# Patient Record
Sex: Male | Born: 1955 | Race: Black or African American | Hispanic: No | Marital: Married | Smoking: Former smoker
Health system: Southern US, Community
[De-identification: ages and names within clinical notes are randomized; demographics above are authoritative.]

## PROBLEM LIST (undated history)

## (undated) DIAGNOSIS — I209 Angina pectoris, unspecified: Secondary | ICD-10-CM

## (undated) DIAGNOSIS — E119 Type 2 diabetes mellitus without complications: Secondary | ICD-10-CM

## (undated) DIAGNOSIS — J449 Chronic obstructive pulmonary disease, unspecified: Secondary | ICD-10-CM

## (undated) DIAGNOSIS — Z87442 Personal history of urinary calculi: Secondary | ICD-10-CM

## (undated) DIAGNOSIS — M1990 Unspecified osteoarthritis, unspecified site: Secondary | ICD-10-CM

## (undated) DIAGNOSIS — I10 Essential (primary) hypertension: Secondary | ICD-10-CM

## (undated) HISTORY — DX: Essential (primary) hypertension: I10

## (undated) HISTORY — PX: KIDNEY STONE SURGERY: SHX686

## (undated) HISTORY — PX: ACNE CYST REMOVAL: SUR1112

---

## 2002-03-20 DIAGNOSIS — E785 Hyperlipidemia, unspecified: Secondary | ICD-10-CM | POA: Insufficient documentation

## 2004-03-20 DIAGNOSIS — I10 Essential (primary) hypertension: Secondary | ICD-10-CM | POA: Insufficient documentation

## 2006-06-16 DIAGNOSIS — Z800 Family history of malignant neoplasm of digestive organs: Secondary | ICD-10-CM | POA: Insufficient documentation

## 2006-06-16 DIAGNOSIS — N529 Male erectile dysfunction, unspecified: Secondary | ICD-10-CM | POA: Insufficient documentation

## 2006-06-16 DIAGNOSIS — Z8249 Family history of ischemic heart disease and other diseases of the circulatory system: Secondary | ICD-10-CM | POA: Insufficient documentation

## 2006-10-23 DIAGNOSIS — L259 Unspecified contact dermatitis, unspecified cause: Secondary | ICD-10-CM | POA: Insufficient documentation

## 2007-06-10 DIAGNOSIS — Z8042 Family history of malignant neoplasm of prostate: Secondary | ICD-10-CM | POA: Insufficient documentation

## 2007-06-10 DIAGNOSIS — F17210 Nicotine dependence, cigarettes, uncomplicated: Secondary | ICD-10-CM | POA: Insufficient documentation

## 2007-08-12 ENCOUNTER — Ambulatory Visit: Payer: Self-pay | Admitting: Gastroenterology

## 2008-01-27 ENCOUNTER — Inpatient Hospital Stay: Payer: Self-pay | Admitting: Internal Medicine

## 2008-07-07 DIAGNOSIS — G4710 Hypersomnia, unspecified: Secondary | ICD-10-CM | POA: Insufficient documentation

## 2009-04-30 DIAGNOSIS — E559 Vitamin D deficiency, unspecified: Secondary | ICD-10-CM | POA: Insufficient documentation

## 2011-10-20 LAB — HEPATIC FUNCTION PANEL: ALT: 18 U/L (ref 10–40)

## 2012-12-27 LAB — LIPID PANEL
Cholesterol: 165 mg/dL (ref 0–200)
HDL: 54 mg/dL (ref 35–70)
LDL Cholesterol: 96 mg/dL
Triglycerides: 75 mg/dL (ref 40–160)

## 2012-12-27 LAB — BASIC METABOLIC PANEL
BUN: 14 mg/dL (ref 4–21)
Creatinine: 1.2 mg/dL (ref 0.6–1.3)
GLUCOSE: 88 mg/dL
POTASSIUM: 5.1 mmol/L (ref 3.4–5.3)
SODIUM: 140 mmol/L (ref 137–147)

## 2013-03-11 LAB — HM COLONOSCOPY

## 2013-06-02 ENCOUNTER — Ambulatory Visit: Payer: Self-pay | Admitting: Family Medicine

## 2014-09-03 ENCOUNTER — Encounter: Payer: Self-pay | Admitting: *Deleted

## 2014-09-03 ENCOUNTER — Ambulatory Visit (INDEPENDENT_AMBULATORY_CARE_PROVIDER_SITE_OTHER): Payer: PRIVATE HEALTH INSURANCE | Admitting: Family Medicine

## 2014-09-03 ENCOUNTER — Ambulatory Visit
Admission: RE | Admit: 2014-09-03 | Discharge: 2014-09-03 | Disposition: A | Discharge status: Home / Self Care | Payer: PRIVATE HEALTH INSURANCE | Source: Ambulatory Visit | Attending: Family Medicine | Admitting: Family Medicine

## 2014-09-03 ENCOUNTER — Encounter: Payer: Self-pay | Admitting: Family Medicine

## 2014-09-03 VITALS — BP 130/80 | HR 62 | Temp 98.9°F | Resp 17 | Ht 66.0 in | Wt 196.4 lb

## 2014-09-03 DIAGNOSIS — M47896 Other spondylosis, lumbar region: Secondary | ICD-10-CM | POA: Diagnosis not present

## 2014-09-03 DIAGNOSIS — M179 Osteoarthritis of knee, unspecified: Secondary | ICD-10-CM | POA: Insufficient documentation

## 2014-09-03 DIAGNOSIS — M545 Low back pain: Secondary | ICD-10-CM | POA: Diagnosis not present

## 2014-09-03 DIAGNOSIS — M5410 Radiculopathy, site unspecified: Secondary | ICD-10-CM | POA: Diagnosis not present

## 2014-09-03 DIAGNOSIS — M7040 Prepatellar bursitis, unspecified knee: Secondary | ICD-10-CM | POA: Insufficient documentation

## 2014-09-03 DIAGNOSIS — M1990 Unspecified osteoarthritis, unspecified site: Secondary | ICD-10-CM | POA: Insufficient documentation

## 2014-09-03 DIAGNOSIS — M1710 Unilateral primary osteoarthritis, unspecified knee: Secondary | ICD-10-CM | POA: Insufficient documentation

## 2014-09-03 DIAGNOSIS — K219 Gastro-esophageal reflux disease without esophagitis: Secondary | ICD-10-CM | POA: Insufficient documentation

## 2014-09-03 DIAGNOSIS — M549 Dorsalgia, unspecified: Secondary | ICD-10-CM | POA: Diagnosis present

## 2014-09-03 MED ORDER — PREDNISONE 10 MG PO TABS: ORAL_TABLET | ORAL | Status: AC

## 2014-09-03 NOTE — Progress Notes (Signed)
Patient ID: Jesus Mack, male   DOB: [birth year], 59 y.o.   MRN: [id number]   Patient: Jesus Mack Male    DOB: Dec 08, 1955   59 y.o.   MRN: [id number] Visit Date: 09/03/2014  Today's Provider: Lelon Huh, MD   Chief Complaint  Patient presents with  . Back Pain    started last few weeks, but got worst last couple of days.  . Leg Pain    Right. started a few days later after the back pain had started.   Subjective:    Back Pain This is a new problem. The current episode started 1 to 4 weeks ago. The problem occurs daily (started intermittently and now constant pain). The problem has been gradually worsening since onset. The pain is present in the lumbar spine and gluteal. The quality of the pain is described as aching and stabbing. The pain radiates to the right foot (right leg). The pain is at a severity of 6/10. The pain is moderate. The pain is the same all the time. Exacerbated by: with/out activity. Stiffness is present all day. Associated symptoms include leg pain and numbness. He has tried NSAIDs and heat for the symptoms. The treatment provided mild relief.  Leg Pain  The incident occurred more than 1 week ago (about the same time when the back pain started). Incident location: "don't remember doing nothing straighneous" The injury mechanism is unknown. The pain is present in the right leg, right thigh and right hip. The quality of the pain is described as stabbing (dull pain in hip, but stabbing pain in leg.). The pain is at a severity of 8/10. The pain is moderate. The pain has been constant since onset. Associated symptoms include numbness. He reports no foreign bodies present. Nothing aggravates the symptoms. He has tried heat and NSAIDs for the symptoms. The treatment provided mild relief.   He states he had similar pain about 15 years ago attributed to herniated disk and required steroid injections.    Previous Medications   AMLODIPINE (NORVASC) 10 MG TABLET    Take by  mouth.   ASPIRIN 81 MG TABLET       LOVASTATIN (MEVACOR) 40 MG TABLET    Take by mouth.   MULTIPLE VITAMIN PO    Take by mouth.   SILDENAFIL (VIAGRA) 100 MG TABLET    Take by mouth.   TRIAMCINOLONE CREAM (KENALOG) 0.1 %    TRIAMCINOLONE ACETONIDE, 0.1% (External Cream)  Apply sparingly to affected area twice daily for 0 days  Quantity: 30.00;  Refills: 1   Ordered :15-Nov-2009  Meyer Cory ;  Started 15-Nov-2009 Active Comments: DX: 692.9    Review of Systems  Constitutional: Positive for activity change.       Because of the pain  HENT: Negative.   Eyes: Negative.   Respiratory: Negative.   Cardiovascular: Negative.   Gastrointestinal: Negative.   Endocrine: Negative.   Genitourinary: Negative.   Musculoskeletal: Positive for back pain.       Back and right leg  Skin: Negative.   Allergic/Immunologic: Negative.   Neurological: Positive for numbness.       Lower back numbness  Hematological: Negative.   Psychiatric/Behavioral: Negative.     History  Substance Use Topics  . Smoking status: Current Every Day Smoker -- 1.00 packs/day for 30 years    Types: Cigarettes  . Smokeless tobacco: Not on file  . Alcohol Use: No   Objective:   BP 130/80 mmHg  Pulse 62  Temp(Src) 98.9 F (37.2 C) (Oral)  Resp 17  Ht 5\' 6"  (1.676 m)  Wt 196 lb 6.4 oz (89.086 kg)  BMI 31.71 kg/m2  SpO2 97%  Physical Exam   General Appearance:    Alert, cooperative, no distress  Eyes:    PERRL, conjunctiva/corneas clear, EOM's intact       Lungs:     Clear to auscultation bilaterally, respirations unlabored  Heart:    Regular rate and rhythm  Neurologic:   Awake, alert, oriented x 3. No apparent focal neurological           defect.   MS:   Tender over lower lumbar spine. No gross deformities. +5 muslce strength both LEs. Normal s/s       Assessment & Plan:     1. Back pain with right-sided radiculopathy  - DG Lumbar Spine Complete; Future - predniSONE (DELTASONE) 10 MG  tablet; 6 tablets for 2 days, then 5 for 2 days, then 4 for 2 days, then 3 for 2 days, then 2 for 2 days, then 1 for 2 days.  Dispense: 42 tablet; Refill: 0    Follow up: No Follow-up on file.

## 2014-09-04 ENCOUNTER — Telehealth: Payer: Self-pay | Admitting: Family Medicine

## 2014-09-04 ENCOUNTER — Encounter: Payer: Self-pay | Admitting: *Deleted

## 2014-09-04 NOTE — Telephone Encounter (Signed)
Pt stated he was returning the nurse's call about his X ray and would like a call back. Thanks TNP

## 2014-09-07 NOTE — Telephone Encounter (Signed)
Patient notified of results.

## 2014-12-16 ENCOUNTER — Other Ambulatory Visit: Payer: Self-pay | Admitting: Family Medicine

## 2015-01-14 ENCOUNTER — Other Ambulatory Visit: Payer: Self-pay | Admitting: Family Medicine

## 2015-02-18 ENCOUNTER — Other Ambulatory Visit: Payer: Self-pay | Admitting: Family Medicine

## 2015-03-19 ENCOUNTER — Other Ambulatory Visit: Payer: Self-pay | Admitting: Family Medicine

## 2015-04-17 ENCOUNTER — Other Ambulatory Visit: Payer: Self-pay | Admitting: Family Medicine

## 2015-05-20 ENCOUNTER — Other Ambulatory Visit: Payer: Self-pay | Admitting: Family Medicine

## 2015-06-17 ENCOUNTER — Other Ambulatory Visit: Payer: Self-pay | Admitting: Family Medicine

## 2015-06-18 ENCOUNTER — Ambulatory Visit (INDEPENDENT_AMBULATORY_CARE_PROVIDER_SITE_OTHER): Payer: PRIVATE HEALTH INSURANCE | Admitting: Family Medicine

## 2015-06-18 ENCOUNTER — Encounter: Payer: Self-pay | Admitting: Family Medicine

## 2015-06-18 VITALS — BP 128/62 | HR 101 | Temp 97.9°F | Resp 18 | Wt 200.0 lb

## 2015-06-18 DIAGNOSIS — M545 Low back pain, unspecified: Secondary | ICD-10-CM

## 2015-06-18 DIAGNOSIS — M7630 Iliotibial band syndrome, unspecified leg: Secondary | ICD-10-CM | POA: Insufficient documentation

## 2015-06-18 DIAGNOSIS — M7632 Iliotibial band syndrome, left leg: Secondary | ICD-10-CM

## 2015-06-18 MED ORDER — PREDNISONE 10 MG PO TABS: ORAL_TABLET | ORAL | Status: DC

## 2015-06-18 NOTE — Progress Notes (Signed)
Patient: Jesus Mack Male    DOB: [birth year]   60 y.o.   MRN: [id number] Visit Date: 06/18/2015  Today's Provider: Lelon Huh, MD   Chief Complaint  Patient presents with  . Back Pain  . Leg Pain   Subjective:    Back Pain This is a new problem. Episode onset: 4 days ago. The problem has been gradually improving since onset. The pain is present in the lumbar spine (left side). The quality of the pain is described as burning, stabbing and aching. The pain radiates to the left knee. The pain is moderate. The symptoms are aggravated by standing. Associated symptoms include leg pain and numbness (in left leg). Pertinent negatives include no abdominal pain, chest pain, dysuria, fever, headaches, tingling, weakness or weight loss. He has tried NSAIDs for the symptoms. The treatment provided moderate relief.  Leg Pain  Incident onset: 1 day ago. There was no injury mechanism. The pain is present in the left leg (starts i lower back and radiates down to left knee). Associated symptoms include numbness (in left leg). Pertinent negatives include no tingling. He has tried NSAIDs for the symptoms. The treatment provided mild relief.  Patient reports last night he noticed a hard knot on the side of his left leg.   He was seen for back pain last June and LS spine XR showed degenerative disc disease with disc height loss at L2-3 andL3-4. There is bilateral facet arthropathy at L4-5 and L5-S1.  He was treated with 12 day prednisone taper at that time which worked well.     No Known Allergies Previous Medications   AMLODIPINE (NORVASC) 10 MG TABLET    TAKE 1 TABLET (10 MG TOTAL) BY MOUTH DAILY. NEED TO SCHEDULE OFFICE VISIT FOR FOLLOW UP THIS MONTH   ASPIRIN 81 MG TABLET       LOVASTATIN (MEVACOR) 40 MG TABLET    Take by mouth.   MULTIPLE VITAMIN PO    Take by mouth.   SILDENAFIL (VIAGRA) 100 MG TABLET    Take by mouth.   TRIAMCINOLONE CREAM (KENALOG) 0.1 %    TRIAMCINOLONE ACETONIDE,  0.1% (External Cream)  Apply sparingly to affected area twice daily for 0 days  Quantity: 30.00;  Refills: 1   Ordered :15-Nov-2009  Meyer Cory ;  Started 15-Nov-2009 Active Comments: DX: 692.9    Review of Systems  Constitutional: Negative for fever, chills, weight loss and appetite change.  Respiratory: Positive for chest tightness (feels a catch in his chest wall muscle when twisting). Negative for shortness of breath and wheezing.   Cardiovascular: Negative for chest pain and palpitations.  Gastrointestinal: Negative for nausea, vomiting and abdominal pain.  Genitourinary: Negative for dysuria.  Musculoskeletal: Positive for myalgias (left leg), back pain and arthralgias.  Neurological: Positive for numbness (in left leg). Negative for tingling, weakness and headaches.    Social History  Substance Use Topics  . Smoking status: Current Every Day Smoker -- 1.00 packs/day for 30 years    Types: Cigarettes  . Smokeless tobacco: Not on file  . Alcohol Use: No   Objective:   BP 128/62 mmHg  Pulse 101  Temp(Src) 97.9 F (36.6 C) (Oral)  Resp 18  Wt 200 lb (90.719 kg)  SpO2 95%  Physical Exam  General appearance: alert, well developed, well nourished, cooperative and in no distress Head: Normocephalic, without obvious abnormality, atraumatic MS: Moderate tenderness over LS spine and left paraspinous muscles. No tenderness on right.  Tender down right side of hip and lateral leg along ITB. No gross deformities. Normal strength and tone of LEs.      Assessment & Plan:     1. Left-sided low back pain without sciatica Responded well to prednisone in the past.  - predniSONE (DELTASONE) 10 MG tablet; 6 tablets for 2 days, then 5 for 2 days, then 4 for 2 days, then 3 for 2 days, then 2 for 2 days, then 1 for 2 days.  Dispense: 42 tablet; Refill: 0  2. Iliotibial band syndrome, left  - predniSONE (DELTASONE) 10 MG tablet; 6 tablets for 2 days, then 5 for 2 days, then 4 for  2 days, then 3 for 2 days, then 2 for 2 days, then 1 for 2 days.  Dispense: 42 tablet; Refill: 0        Lelon Huh, MD  [facility]

## 2015-06-25 ENCOUNTER — Encounter: Payer: Self-pay | Admitting: Family Medicine

## 2015-06-25 ENCOUNTER — Ambulatory Visit (INDEPENDENT_AMBULATORY_CARE_PROVIDER_SITE_OTHER): Payer: PRIVATE HEALTH INSURANCE | Admitting: Family Medicine

## 2015-06-25 VITALS — BP 142/72 | HR 72 | Temp 98.3°F | Resp 16 | Wt 200.0 lb

## 2015-06-25 DIAGNOSIS — M545 Low back pain, unspecified: Secondary | ICD-10-CM

## 2015-06-25 DIAGNOSIS — M5416 Radiculopathy, lumbar region: Secondary | ICD-10-CM | POA: Diagnosis not present

## 2015-06-25 MED ORDER — PREDNISONE 10 MG PO TABS: ORAL_TABLET | ORAL | Status: AC

## 2015-06-25 NOTE — Progress Notes (Signed)
Patient ID: Jesus Mack, male   DOB: 14-Aug-1955, 60 y.o.   MRN: [id number]       Patient: Jesus Mack Male    DOB: November 17, 1955   60 y.o.   MRN: [id number] Visit Date: 06/25/2015  Today's Provider: Lelon Huh, MD   Chief Complaint  Patient presents with  . Back Pain  . Leg Pain   Subjective:    Back Pain This is a recurrent problem. The current episode started yesterday. The problem has been waxing and waning since onset. The pain is present in the lumbar spine. The quality of the pain is described as aching. The pain is at a severity of 8/10. The pain is moderate. The symptoms are aggravated by bending, lying down and sitting. Stiffness is present all day. Associated symptoms include leg pain. Pertinent negatives include no fever, numbness, tingling or weakness.  Leg Pain  There was no injury mechanism. The pain is present in the left leg. The quality of the pain is described as aching. The pain is moderate. The pain has been constant since onset. Pertinent negatives include no muscle weakness, numbness or tingling. Associated symptoms comments: Patient describes pain as throbbing.   Patient was seen on 06/18/15 with back pain and pain down side of his left leg. He was prescribed 12 day prednisone taper and pain had nearly resolved. He states yesterday he was changing tires on vehicle at work and back and leg started hurting again, and is much worse this morning. This time pain radiates down the front of his leg. Does not feel week in legs.      No Known Allergies Previous Medications   AMLODIPINE (NORVASC) 10 MG TABLET    TAKE 1 TABLET (10 MG TOTAL) BY MOUTH DAILY. NEED TO SCHEDULE OFFICE VISIT FOR FOLLOW UP THIS MONTH   ASPIRIN 81 MG TABLET       LOVASTATIN (MEVACOR) 40 MG TABLET    Take by mouth.   MULTIPLE VITAMIN PO    Take by mouth.   PREDNISONE (DELTASONE) 10 MG TABLET    6 tablets for 2 days, then 5 for 2 days, then 4 for 2 days, then 3 for 2 days, then 2 for 2 days,  then 1 for 2 days.   SILDENAFIL (VIAGRA) 100 MG TABLET    Take by mouth.   TRIAMCINOLONE CREAM (KENALOG) 0.1 %    TRIAMCINOLONE ACETONIDE, 0.1% (External Cream)  Apply sparingly to affected area twice daily for 0 days  Quantity: 30.00;  Refills: 1   Ordered :15-Nov-2009  Meyer Cory ;  Started 15-Nov-2009 Active Comments: DX: 692.9    Review of Systems  Constitutional: Positive for activity change. Negative for fever, chills, diaphoresis, appetite change, fatigue and unexpected weight change.  Gastrointestinal: Negative.   Musculoskeletal: Positive for back pain.  Neurological: Negative for tingling, tremors, weakness and numbness.    Social History  Substance Use Topics  . Smoking status: Current Every Day Smoker -- 1.00 packs/day for 30 years    Types: Cigarettes  . Smokeless tobacco: Not on file  . Alcohol Use: No   Objective:   BP 142/72 mmHg  Temp(Src) 98.3 F (36.8 C)  Resp 16  Wt 200 lb (90.719 kg)  Physical Exam  General appearance: alert, well developed, well nourished, cooperative and in no distress Head: Normocephalic, without obvious abnormality, atraumatic Back: Mild tenderness of lumbar spine, no gross deformities: Extremities: Tender left anterior leg and left hip. No swelling or gross deformities. Positive straight  leg raise, no pain with knee extension.       Assessment & Plan:     1. Left-sided low back pain without sciatica  - Ambulatory referral to Physical Therapy - predniSONE (DELTASONE) 10 MG tablet; 6 tablets for 2 days, then 5 for 2 days, then 4 for 2 days, then 3 for 2 days, then 2 for 2 days, then 1 daily  Dispense: 30 tablet; Refill: 0  2. Lumbar radiculopathy Initially responded well to prednisone. Will go back up to 60mg  daily and initiated PT referral. Consider MRI if worsening after starting PT, of not improved after completing PT.  - Ambulatory referral to Physical Therapy - predniSONE (DELTASONE) 10 MG tablet; 6 tablets for 2  days, then 5 for 2 days, then 4 for 2 days, then 3 for 2 days, then 2 for 2 days, then 1 daily  Dispense: 30 tablet; Refill: 0        Lelon Huh, MD  [facility]

## 2015-07-13 ENCOUNTER — Other Ambulatory Visit: Payer: Self-pay | Admitting: Family Medicine

## 2015-07-14 ENCOUNTER — Other Ambulatory Visit: Payer: Self-pay | Admitting: Family Medicine

## 2015-07-28 ENCOUNTER — Ambulatory Visit: Payer: No Typology Code available for payment source | Attending: Family Medicine | Admitting: Physical Therapy

## 2015-07-28 ENCOUNTER — Encounter: Payer: Self-pay | Admitting: Physical Therapy

## 2015-07-28 ENCOUNTER — Encounter: Payer: Self-pay | Admitting: Family Medicine

## 2015-07-28 ENCOUNTER — Ambulatory Visit (INDEPENDENT_AMBULATORY_CARE_PROVIDER_SITE_OTHER): Payer: PRIVATE HEALTH INSURANCE | Admitting: Family Medicine

## 2015-07-28 VITALS — BP 120/74 | HR 68 | Temp 98.2°F | Resp 16 | Ht 66.0 in | Wt 198.0 lb

## 2015-07-28 DIAGNOSIS — Z720 Tobacco use: Secondary | ICD-10-CM

## 2015-07-28 DIAGNOSIS — R262 Difficulty in walking, not elsewhere classified: Secondary | ICD-10-CM | POA: Diagnosis present

## 2015-07-28 DIAGNOSIS — I10 Essential (primary) hypertension: Secondary | ICD-10-CM

## 2015-07-28 DIAGNOSIS — E559 Vitamin D deficiency, unspecified: Secondary | ICD-10-CM | POA: Diagnosis not present

## 2015-07-28 DIAGNOSIS — E785 Hyperlipidemia, unspecified: Secondary | ICD-10-CM

## 2015-07-28 DIAGNOSIS — M5416 Radiculopathy, lumbar region: Secondary | ICD-10-CM

## 2015-07-28 DIAGNOSIS — M545 Low back pain, unspecified: Secondary | ICD-10-CM

## 2015-07-28 NOTE — Patient Instructions (Signed)
   Please stop smoking   Call for referral for low dose lung CT scan at your convenience.

## 2015-07-28 NOTE — Therapy (Signed)
[place] MAIN [facility] SERVICES [address] [place], [place], [address] Phone: [phone]   Fax:  [phone]  Physical Therapy Evaluation  Patient Details  Name: Jesus Mack MRN: [id number] Date of Birth: [birth year] Referring Provider: Lelon Huh MD  Encounter Date: 07/28/2015      PT End of Session - 07/28/15 1131    Visit Number 1   Number of Visits 9   Date for PT Re-Evaluation 08/25/15   PT Start Time 1020   PT Stop Time 1105   PT Time Calculation (min) 45 min   Activity Tolerance Patient tolerated treatment well;No increased pain   Behavior During Therapy [facility] for tasks assessed/performed      Past Medical History  Diagnosis Date  . Hypertension     controlled with medication;     Past Surgical History  Procedure Laterality Date  . Acne cyst removal Right     Shoulder  . Kidney stone surgery      Blasted    There were no vitals filed for this visit.       Subjective Assessment - 07/28/15 1026    Subjective 60 yo male s/p low back pain with LLE radiculopathy; He reports hurting himself at work (he was putting on a set of tires) He reports that he was using a machine that blows up the tire real fast; He reports that when he uses it, he puts it against his body and then you feel a rebound into the stomach. He reports that this occurred April 4-5, 2017; He reports feeling immediate stabbing pain. He went to MD and he was prescribed a prednisone taper. He reports that 2-3 weeks ago when he stopped taking the prednisone he started feeling pain and numbness in his back and down his left leg; He reports that this is not the first time he has had back injury; He reports having an injury last summer (similar injury) and reports that the prednisone took away all pain. He reports that this is the first time he has had numbness and progression of symptoms down his leg; He is still working full-time, Lobbyist; He works as a  Dealer; He has a history of chronic back pain and has undergone steriod injections and prednisone tapers for pain control;    Pertinent History personal factors affecting rehab: Work tasks (as a Dealer with constant lifting and bending)   How long can you sit comfortably? 45 min-1 hour   How long can you stand comfortably? 30 min;    How long can you walk comfortably? 1/2 mile   Diagnostic tests He had x-rays last year which showed spondylosis but otherwise unremarkable; No radiographs recently;    Patient Stated Goals reduce numbness in LE   Currently in Pain? Yes   Pain Score 6    Pain Location Knee   Pain Orientation Left   Pain Descriptors / Indicators Numbness   Pain Type Chronic pain   Pain Onset More than a month ago   Pain Frequency Intermittent   Aggravating Factors  standing or sitting will increase numbness;    Pain Relieving Factors moving around   Effect of Pain on Daily Activities trying to stay active;             [facility] PT Assessment - 07/28/15 0001    Assessment   Medical Diagnosis Low back pain with LLE radiculopathy   Referring Provider Lelon Huh MD   Onset Date/Surgical Date 06/19/15  Hand Dominance Right   Next MD Visit not scheduled   Prior Therapy denies any PT for this condition;    Precautions   Precautions None   Restrictions   Weight Bearing Restrictions No   Other Position/Activity Restrictions no restrictions reported   Balance Screen   Has the patient fallen in the past 6 months No   Has the patient had a decrease in activity level because of a fear of falling?  No   Is the patient reluctant to leave their home because of a fear of falling?  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;    Leisure used to fish, no significant hobbies;    Cognition   Overall Cognitive Status Within Functional Limits for tasks assessed   Observation/Other Assessments   Observations demonstrates increased pain with repeated lumbar extension;    Sensation   Light Touch Appears Intact   Additional Comments reports numbness down LLE anterior thigh to knee but has intact light touch/deep pressure   Coordination   Gross Motor Movements are Fluid and Coordinated Yes   Fine Motor Movements are Fluid and Coordinated Yes   Posture/Postural Control   Posture Comments sits with erect posture, no abnormality noted   AROM   Overall AROM Comments BUE and BLE AROM is WFL; demonstrates functional lumbar ROM   Strength   Overall Strength Comments BLE grossly 4+/5;    Palpation   Spinal mobility hypomobility noted at L4, L5 with slight increase in discomfort with PA mobs; otherwise lumbar spine is normal;   Palpation comment has mild tenderness to palpation of lumbosacral paraspinals;    FABER test   findings Positive   Side LEft   Slump test   Findings Positive   Side Left   Prone Knee Bend Test   Findings Negative   Side Left   Straight Leg Raise   Findings Positive   Side  Left   Comment at 50 degrees flexion   Transfers   Comments able to transfer without HHA   Ambulation/Gait   Gait Comments ambulates independently without AD demonstrating normal gait pattern   Standardized Balance Assessment   Five times sit to stand comments  12.5 sec (low fall risk)   10 Meter Walk 1.17 m/s without AD (community ambulator, low fall risk)       TREATMENT: PT initiated HEP: Hooklying: Lumbar trunk rotation to right only x2 min with cues to avoid painful ROM; Single leg to chest 15 sec hold x2 on LLE; Seated: Trunk flexion AROM x5 reps Patient required min-moderate verbal/tactile cues for correct exercise  technique.                     PT Education - 07/28/15 1130    Education provided Yes   Education Details HEP initiated, posture, recommendations/findings   Person(s) Educated Patient   Methods Explanation;Verbal cues;Handout   Comprehension Verbalized understanding;Returned demonstration;Verbal cues required             PT Long Term Goals - 07/28/15 1142    PT LONG TERM GOAL #1   Title Patient will be independent in  home exercise program to improve strength/mobility for better functional independence with ADLs.   Time 4   Period Weeks   Status New   PT LONG TERM GOAL #2   Title Patient will report a worst pain of 3/10 on VAS in      back/LLE       to improve tolerance with ADLs and reduced symptoms with activities.    Time 4   Period Weeks   Status New   PT LONG TERM GOAL #3   Title Patient will reduce modified Oswestry score to <20 as to demonstrate minimal disability with ADLs including improved sleeping tolerance, walking/sitting tolerance etc for better mobility with ADLs.    Time 4   Period Weeks   Status New   PT LONG TERM GOAL #4   Title Patient will report no numbness in LLE over 2-3 days for centralization of symptoms and to reduce occurance of weakness in LE.   Time 4   Period Weeks   Status New               Plan - 07/28/15 1131    Clinical Impression Statement 60 yo Male reports experiencing an injury at work where he was trying to inflate a large tire and the machine rebound brought on immediate low back pain. He has had chronic back pain in the past that was relieved with steriod injections and/or prednisone. He went to see physician who prescribed prednisone with some relief. However for last 2-3 weeks he has been experiencing increased numbness in LLE to knee. Patient exhibits increased tightness and pain at L4, L5 with PA mobs. He reports numbness along L4 dermatome. Patient demonstrates a lumbar flexion preference with increased  numbness with repeated prone position. He would benefit from skilled PT intervention to improve lumbar ROM, reduce pain with ADL s and reduce numbness in LE for return to PLOF.    Rehab Potential Good   Clinical Impairments Affecting Rehab Potential positive: good PLOF, young in age; negative: history of re-injury, repetitive tasks at work; Patient's clinical presentation is stable as he has has minimal pain and numbness is localized to low back/LE;    PT Frequency 2x / week   PT Duration 4 weeks   PT Treatment/Interventions Cryotherapy;Electrical Stimulation;Moist Heat;Traction;Therapeutic exercise;Therapeutic activities;Functional mobility training;Ultrasound;Patient/family education;Manual techniques;Taping;Energy conservation;Dry needling   PT Next Visit Plan advance HEP, try traction   PT Home Exercise Plan initiated- see patient instructions   Consulted and Agree with Plan of Care Patient      Patient will benefit from skilled therapeutic intervention in order to improve the following deficits and impairments:  Hypomobility, Pain, Decreased mobility, Decreased range of motion, Improper body mechanics, Impaired flexibility  Visit Diagnosis: Left-sided low back pain without sciatica - Plan: PT plan of care cert/re-cert  Radiculopathy, lumbar region - Plan: PT plan of care cert/re-cert  Difficulty in walking, not elsewhere classified - Plan: PT plan of care cert/re-cert     Problem List Patient Active Problem List   Diagnosis Date Noted  . Lower back pain 06/18/2015  . Iliotibial band syndrome 06/18/2015  . Gastroesophageal reflux disease 09/03/2014  . Arthritis, degenerative 09/03/2014  . Arthritis of knee, degenerative 09/03/2014  . Vitamin D deficiency 04/30/2009  . Difficulty staying awake 07/07/2008  . Family history of malignant neoplasm of prostate 06/10/2007  . Tobacco abuse 06/10/2007  . Family history of cardiovascular disease 06/16/2006  . Family history of cancer  of digestive system 06/16/2006  . 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 Essential hypertension 03/20/2004  . HLD (hyperlipidemia) 03/20/2002    Corion Sherrod PT, DPT 07/28/2015, 11:45 AM  [place] MAIN [facility] SERVICES [address] [place], [place], [address] Phone: [phone]   Fax:  [phone]  Name: Jesus Mack MRN: [id number] Date of Birth: Apr 16, 1955

## 2015-07-28 NOTE — Patient Instructions (Signed)
  Copyright  VHI. All rights reserved. Knee to Chest (Flexion)   Pull knee toward chest. Feel stretch in lower back or buttock area. Breathing deeply, Hold __15__ seconds. Repeat with other knee. Repeat _2-3___ times. Do _2-3___ sessions per day.  http://[email or web address].exer.[email or web address]   Copyright  VHI. All rights reserved.   Lower Trunk Rotation Stretch  Lying on back with knees bent, Keeping back flat and feet together, rotate knees to right side and then back to middle slowly and in pain free range of motion. Avoid going to left side;  Hold _2___ seconds. Repeat for 1-2 minutes. Do __1__ sets per session. Do __2-3__ sessions per day.  http://orth.exer.us/122    Low Back Stretch (Sitting)    Sit with legs apart, bend forward slowly from the neck down, feeling a gentle stretch progress to the low back. Hold _2-3___ seconds. Repeat _10___ times. Do 2____ sessions per day.  Copyright  VHI. All rights reserved.

## 2015-07-28 NOTE — Progress Notes (Signed)
Patient: Jesus Mack Male    DOB: 18-Oct-1955   60 y.o.   MRN: [id number] Visit Date: 07/28/2015  Today's Provider: Lelon Huh, MD   Chief Complaint  Patient presents with  . Follow-up  . Hypertension  . Hyperlipidemia   Subjective:    HPI      Hypertension, follow-up:  BP Readings from Last 3 Encounters:  07/28/15 120/74  06/25/15 142/72  06/18/15 128/62    He was last seen for hypertension 12/27/2012.  BP at that visit was 126/72. Management since that visit includes; no changes.He reports fair compliance with treatment. He is not having side effects. none  He is exercising. He is not adherent to low salt diet.   Outside blood pressures are no. He is experiencing none.  Patient denies none.   Cardiovascular risk factors include none.  Use of agents associated with hypertension: none.   ----------------------------------------------------------------------    Lipid/Cholesterol, Follow-up:   Last seen for this 12/27/2012.  Management since that visit includes; no changes.  Last Lipid Panel:    Component Value Date/Time   CHOL 165 12/27/2012   TRIG 75 12/27/2012   HDL 54 12/27/2012   LDLCALC 96 12/27/2012    He reports fair compliance with treatment. He is not having side effects. none  Wt Readings from Last 3 Encounters:  07/28/15 198 lb (89.812 kg)  06/25/15 200 lb (90.719 kg)  06/18/15 200 lb (90.719 kg)    ----------------------------------------------------------------------  Tobacco abuse: Is still smoking about a pack a day. He would like to quit, but hasn't tried lately. Is not interested in prescription medications. .    No Known Allergies Previous Medications   AMLODIPINE (NORVASC) 10 MG TABLET    Take 1 tablet (10 mg total) by mouth daily.   ASPIRIN 81 MG TABLET       LOVASTATIN (MEVACOR) 40 MG TABLET    TAKE 1 TABLET BY MOUTH EVERY DAY   MULTIPLE VITAMIN PO    Take by mouth.    Review of Systems    Constitutional: Negative for fever, chills and appetite change.  Respiratory: Negative for chest tightness, shortness of breath and wheezing.   Cardiovascular: Negative for chest pain and palpitations.  Gastrointestinal: Negative for nausea, vomiting and abdominal pain.    Social History  Substance Use Topics  . Smoking status: Current Every Day Smoker -- 1.00 packs/day for 30 years    Types: Cigarettes  . Smokeless tobacco: Not on file  . Alcohol Use: No   Objective:   BP 120/74 mmHg  Pulse 68  Temp(Src) 98.2 F (36.8 C) (Oral)  Resp 16  Ht 5\' 6"  (1.676 m)  Wt 198 lb (89.812 kg)  BMI 31.97 kg/m2  SpO2 95%  Physical Exam   General Appearance:    Alert, cooperative, no distress  Eyes:    PERRL, conjunctiva/corneas clear, EOM's intact       Lungs:     Clear to auscultation bilaterally, respirations unlabored  Heart:    Regular rate and rhythm  Neurologic:   Awake, alert, oriented x 3. No apparent focal neurological           defect.       EKG: NSR     Assessment & Plan:     1. HLD (hyperlipidemia) He is tolerating lovastatin well with no adverse effects.   - Lipid panel - Hepatic function panel  2. Essential hypertension Well controlled.  Continue current medications.   -  EKG 12-Lead - PSA - Renal function panel  3. Vitamin D deficiency  - VITAMIN D 25 Hydroxy (Vit-D Deficiency, Fractures)  4. Tobacco abuse Advised to quit smoking. Discussed and given printed information regarding low dose CT screening.        Lelon Huh, MD  [facility]

## 2015-07-29 LAB — LIPID PANEL
Chol/HDL Ratio: 3.3 ratio units (ref 0.0–5.0)
Cholesterol, Total: 183 mg/dL (ref 100–199)
HDL: 56 mg/dL (ref >39)
LDL CALC: 110 mg/dL — AB (ref 0–99)
Triglycerides: 87 mg/dL (ref 0–149)
VLDL CHOLESTEROL CAL: 17 mg/dL (ref 5–40)

## 2015-07-29 LAB — HEPATIC FUNCTION PANEL
ALT: 13 IU/L (ref 0–44)
AST: 20 IU/L (ref 0–40)
Alkaline Phosphatase: 64 IU/L (ref 39–117)
BILIRUBIN TOTAL: 0.3 mg/dL (ref 0.0–1.2)
Bilirubin, Direct: 0.09 mg/dL (ref 0.00–0.40)
TOTAL PROTEIN: 7.2 g/dL (ref 6.0–8.5)

## 2015-07-29 LAB — RENAL FUNCTION PANEL
ALBUMIN: 4.2 g/dL (ref 3.5–5.5)
BUN/Creatinine Ratio: 9 (ref 9–20)
BUN: 10 mg/dL (ref 6–24)
CALCIUM: 9.6 mg/dL (ref 8.7–10.2)
CHLORIDE: 103 mmol/L (ref 96–106)
CO2: 24 mmol/L (ref 18–29)
Creatinine, Ser: 1.16 mg/dL (ref 0.76–1.27)
GFR calc Af Amer: 79 mL/min/{1.73_m2} (ref >59)
GFR calc non Af Amer: 69 mL/min/{1.73_m2} (ref >59)
GLUCOSE: 86 mg/dL (ref 65–99)
PHOSPHORUS: 3.5 mg/dL (ref 2.5–4.5)
POTASSIUM: 5.3 mmol/L — AB (ref 3.5–5.2)
Sodium: 145 mmol/L — ABNORMAL HIGH (ref 134–144)

## 2015-07-29 LAB — VITAMIN D 25 HYDROXY (VIT D DEFICIENCY, FRACTURES): VIT D 25 HYDROXY: 26.8 ng/mL — AB (ref 30.0–100.0)

## 2015-07-29 LAB — PSA: Prostate Specific Ag, Serum: 0.6 ng/mL (ref 0.0–4.0)

## 2015-08-02 ENCOUNTER — Ambulatory Visit: Payer: No Typology Code available for payment source | Admitting: Physical Therapy

## 2015-08-02 ENCOUNTER — Encounter: Payer: Self-pay | Admitting: Physical Therapy

## 2015-08-02 DIAGNOSIS — M5416 Radiculopathy, lumbar region: Secondary | ICD-10-CM

## 2015-08-02 DIAGNOSIS — R262 Difficulty in walking, not elsewhere classified: Secondary | ICD-10-CM

## 2015-08-02 DIAGNOSIS — M545 Low back pain, unspecified: Secondary | ICD-10-CM

## 2015-08-02 NOTE — Therapy (Signed)
[place] MAIN [facility] SERVICES [address] [place], [place], [address] Phone: [phone]   Fax:  [phone]  Physical Therapy Treatment  Patient Details  Name: Jesus Mack MRN: [id number] Date of Birth: 10-07-1955 Referring Provider: Lelon Huh MD  Encounter Date: 08/02/2015      PT End of Session - 08/02/15 1709    Visit Number 2   Number of Visits 9   Date for PT Re-Evaluation 08/25/15   PT Start Time 1630   PT Stop Time 1710   PT Time Calculation (min) 40 min   Activity Tolerance Patient tolerated treatment well;No increased pain   Behavior During Therapy [facility] for tasks assessed/performed      Past Medical History  Diagnosis Date  . Hypertension     controlled with medication;     Past Surgical History  Procedure Laterality Date  . Acne cyst removal Right     Shoulder  . Kidney stone surgery      Blasted    There were no vitals filed for this visit.      Subjective Assessment - 08/02/15 1641    Subjective Patient reports feeling no low back pain since last session. 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 45 min-1 hour   How long can you stand comfortably? 30 min;    How long can you walk comfortably? 1/2 mile   Diagnostic tests He had x-rays last year which showed spondylosis but otherwise unremarkable; No radiographs recently;    Patient Stated Goals reduce numbness in LE   Currently in Pain? Yes   Pain Score 1    Pain Location Knee   Pain Orientation Left   Pain Descriptors / Indicators Aching   Pain Type Chronic pain   Pain Onset More than a month ago      TREATMENT: Patient reports no low back pain; He reports compliance  with HEP;  PT assessed LLE knee, and identified lateral tracking of left patella; PT instructed patient in LLE quad strengthening for better knee stability: Quad set with hip ER 3 sec hold x10; SLR flexion with hip ER x10 with cues to keep RLE bent for better back support;  Sitting: LAQ 3 sec hold x10 LLE with cues to increase terminal knee extension for better quad strengthening;  Standing with green tband around knee: LLE terminal knee extension 3 sec hold x10  Patient reports less LLE knee discomfort in standing; however he continues to have 3/10 LLE knee pain with step negotiation; PT applied Mcconnell tape to left knee with medial glide. Patient reports no knee pain with step negotiation after tape application;  PT educated patient on safe tape wear. Provided written handout for HEP compliance. Patient also re-educated in use of towel roll to reduce back pain with sitting;                           PT Education - 08/02/15 1709    Education provided Yes   Education Details HEP advanced, LE ROM, strengthening   Person(s) Educated Patient   Methods Explanation;Verbal cues   Comprehension Verbalized understanding;Returned demonstration;Verbal cues required  PT Long Term Goals - 07/28/15 1142    PT LONG TERM GOAL #1   Title Patient will be independent in home exercise program to improve strength/mobility for better functional independence with ADLs.   Time 4   Period Weeks   Status New   PT LONG TERM GOAL #2   Title Patient will report a worst pain of 3/10 on VAS in      back/LLE       to improve tolerance with ADLs and reduced symptoms with activities.    Time 4   Period Weeks   Status New   PT LONG TERM GOAL #3   Title Patient will reduce modified Oswestry score to <20 as to demonstrate minimal disability with ADLs including improved sleeping tolerance, walking/sitting tolerance etc for better mobility with ADLs.    Time 4   Period  Weeks   Status New   PT LONG TERM GOAL #4   Title Patient will report no numbness in LLE over 2-3 days for centralization of symptoms and to reduce occurance of weakness in LE.   Time 4   Period Weeks   Status New               Plan - 08/02/15 1709    Clinical Impression Statement Patient reports no low back pain at start of treatment session; He reports resolution of back and LE radicular pain since last week. He does continue to have left knee discomfort. educated patient in LLE quad strengthening and knee stability exercise. Patient required min VCs for correct exercise technique. He responded well with Mcconnell taping with less knee pain at end of treatment session.He would benefit from additional skilled PT Intervention to reduce LLE pain and return to PLOF.    Rehab Potential Good   Clinical Impairments Affecting Rehab Potential positive: good PLOF, young in age; negative: history of re-injury, repetitive tasks at work; Patient's clinical presentation is stable as he has has minimal pain and numbness is localized to low back/LE;    PT Frequency 2x / week   PT Duration 4 weeks   PT Treatment/Interventions Cryotherapy;Electrical Stimulation;Moist Heat;Traction;Therapeutic exercise;Therapeutic activities;Functional mobility training;Ultrasound;Patient/family education;Manual techniques;Taping;Energy conservation;Dry needling   PT Next Visit Plan advance HEP, try traction   PT Home Exercise Plan advanced- see patient instructions;    Consulted and Agree with Plan of Care Patient      Patient will benefit from skilled therapeutic intervention in order to improve the following deficits and impairments:  Hypomobility, Pain, Decreased mobility, Decreased range of motion, Improper body mechanics, Impaired flexibility  Visit Diagnosis: Left-sided low back pain without sciatica  Radiculopathy, lumbar region  Difficulty in walking, not elsewhere classified     Problem  List Patient Active Problem List   Diagnosis Date Noted  . Lower back pain 06/18/2015  . Iliotibial band syndrome 06/18/2015  . Gastroesophageal reflux disease 09/03/2014  . Arthritis, degenerative 09/03/2014  . Arthritis of knee, degenerative 09/03/2014  . Vitamin D deficiency 04/30/2009  . Difficulty staying awake 07/07/2008  . Family history of malignant neoplasm of prostate 06/10/2007  . Tobacco abuse 06/10/2007  . Family history of cardiovascular disease 06/16/2006  . Family history of cancer of digestive system 06/16/2006  . 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 Essential hypertension 03/20/2004  . HLD (hyperlipidemia) 03/20/2002    Trotter,Margaret PT, DPT 08/02/2015, 5:11 PM  [place] MAIN [facility] SERVICES [address] [place], [place], [address] Phone: [phone]   Fax:  [phone]  Name: Jesus Mack MRN: [id number] Date of Birth: July 02, 1955

## 2015-08-02 NOTE — Patient Instructions (Signed)
  Quad Set    With other leg bent, foot flat, slowly tighten muscles on thigh of straight leg while counting out loud to _3-5___. Have foot turned outward Repeat __10__ times. Do __2__ sessions per day.  http://gt2.exer.us/275   Copyright  VHI. All rights reserved.  HIP: Flexion / KNEE: Extension, Straight Leg Raise    Lying down, have leg straight with foot turned outward, Raise leg, keeping knee straight. Perform slowly. __10_ reps per set, _2__ sets per day, _5__ days per week   Copyright  VHI. All rights reserved.  EXTENSION: Sitting (Active)    Sit with feet flat. Straighten right knee. Hold for 3-5 seconds Complete __2_ sets of _10__ repetitions. Perform __2_ sessions per day.  http://gtsc.exer.us/268   Copyright  VHI. All rights reserved.  Knee Extension: Quad Set (Eccentric), (Resistance Band)    Stand holding support, tubing behind fully extended knee. Slowly bend knee for 3-5 seconds. Use ___green_____ resistance band. _10__ reps per set, ___2 sets per day, _5__ days per week.  http://ecce.exer.us/132   Copyright  VHI. All rights reserved.

## 2015-08-04 ENCOUNTER — Encounter: Payer: Self-pay | Admitting: Physical Therapy

## 2015-08-04 ENCOUNTER — Ambulatory Visit: Payer: No Typology Code available for payment source | Admitting: Physical Therapy

## 2015-08-04 DIAGNOSIS — M545 Low back pain, unspecified: Secondary | ICD-10-CM

## 2015-08-04 DIAGNOSIS — M5416 Radiculopathy, lumbar region: Secondary | ICD-10-CM

## 2015-08-04 DIAGNOSIS — R262 Difficulty in walking, not elsewhere classified: Secondary | ICD-10-CM

## 2015-08-04 NOTE — Therapy (Signed)
[place] MAIN [facility] SERVICES [address] [place], [place], [address] Phone: [phone]   Fax:  [phone]  Physical Therapy Treatment  Patient Details  Name: Jesus Mack MRN: [id number] Date of Birth: Feb 18, 1956 Referring Provider: Lelon Huh MD  Encounter Date: 08/04/2015      PT End of Session - 08/04/15 1710    Visit Number 3   Number of Visits 9   Date for PT Re-Evaluation 08/25/15   PT Start Time [id number]   PT Stop Time 1730   PT Time Calculation (min) 35 min   Activity Tolerance Patient tolerated treatment well;No increased pain   Behavior During Therapy [facility] for tasks assessed/performed      Past Medical History  Diagnosis Date  . Hypertension     controlled with medication;     Past Surgical History  Procedure Laterality Date  . Acne cyst removal Right     Shoulder  . Kidney stone surgery      Blasted    There were no vitals filed for this visit.      Subjective Assessment - 08/04/15 1659    Subjective Patient denies any back pain since 1st visit. He reports having a little dull ache in left knee since removing tape. However he reports being able to go up/down a flight of stairs without pain.    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 45 min-1 hour   How long can you stand comfortably? 30 min;    How long can you walk comfortably? 1/2 mile   Diagnostic tests He had x-rays last year which showed spondylosis but otherwise unremarkable; No radiographs recently;    Patient Stated Goals reduce numbness in LE   Currently in Pain? Yes   Pain Score 2    Pain Location Knee   Pain Orientation Left   Pain Descriptors / Indicators Aching;Sore   Pain Type Chronic pain   Pain Onset More than a month ago      TREATMENT: Warm up on Nustep BUE/BLE level 2 x3 min (Unbilled);  Standing with green tband around  knee: LLE terminal knee extension 3 sec hold 2x15 with cues to slow down LLE movement for better quad control; LLE terminal knee extension against ball against wall 3 sec hold x10; Patient reports slight increase in knee pain with additional repetition;  Leg press: BLE 120# 2x10 LLE only 60# 2x10 with cues to slow down eccentric return and to avoid knee valgus/varus for better knee control;  Patient continues to have a little soreness along anterior knee. Finished with interferential TENs to left knee in sitting concurrent with cryotherapy x15 min; Patient denies any pain after treatment session;                            PT Education - 08/04/15 1710    Education provided Yes   Education Details LLE strengthening, quad control;    Person(s) Educated Patient   Methods Explanation;Verbal cues   Comprehension Verbalized understanding;Returned demonstration;Verbal cues required             PT Long Term Goals - 07/28/15 1142    PT LONG TERM GOAL #1   Title Patient will be independent in home exercise program to improve strength/mobility for better functional independence with ADLs.   Time 4   Period Weeks  Status New   PT LONG TERM GOAL #2   Title Patient will report a worst pain of 3/10 on VAS in      back/LLE       to improve tolerance with ADLs and reduced symptoms with activities.    Time 4   Period Weeks   Status New   PT LONG TERM GOAL #3   Title Patient will reduce modified Oswestry score to <20 as to demonstrate minimal disability with ADLs including improved sleeping tolerance, walking/sitting tolerance etc for better mobility with ADLs.    Time 4   Period Weeks   Status New   PT LONG TERM GOAL #4   Title Patient will report no numbness in LLE over 2-3 days for centralization of symptoms and to reduce occurance of weakness in LE.   Time 4   Period Weeks   Status New               Plan - 08/04/15 1724    Clinical Impression  Statement Patient late to treatment session; Patient reports no back pain; She also reports less LLE knee pain being able to negotiate steps with less discomfort. Patient instructed in advanced terminal knee extension quad sets. Patient demonstrates better quad control. Patient responds well to TENs with no pain after treatment session. Patient would benefit from additional skilled PT Intervention to improve lumbar/LLE ROM and reduce pain with ADLs.    Rehab Potential Good   Clinical Impairments Affecting Rehab Potential positive: good PLOF, young in age; negative: history of re-injury, repetitive tasks at work; Patient's clinical presentation is stable as he has has minimal pain and numbness is localized to low back/LE;    PT Frequency 2x / week   PT Duration 4 weeks   PT Treatment/Interventions Cryotherapy;Electrical Stimulation;Moist Heat;Traction;Therapeutic exercise;Therapeutic activities;Functional mobility training;Ultrasound;Patient/family education;Manual techniques;Taping;Energy conservation;Dry needling   PT Next Visit Plan advance HEP, try traction   PT Home Exercise Plan continue as given;    Consulted and Agree with Plan of Care Patient      Patient will benefit from skilled therapeutic intervention in order to improve the following deficits and impairments:  Hypomobility, Pain, Decreased mobility, Decreased range of motion, Improper body mechanics, Impaired flexibility  Visit Diagnosis: Left-sided low back pain without sciatica  Radiculopathy, lumbar region  Difficulty in walking, not elsewhere classified     Problem List Patient Active Problem List   Diagnosis Date Noted  . Lower back pain 06/18/2015  . Iliotibial band syndrome 06/18/2015  . Gastroesophageal reflux disease 09/03/2014  . Arthritis, degenerative 09/03/2014  . Arthritis of knee, degenerative 09/03/2014  . Vitamin D deficiency 04/30/2009  . Difficulty staying awake 07/07/2008  . Family history of  malignant neoplasm of prostate 06/10/2007  . Tobacco abuse 06/10/2007  . Family history of cardiovascular disease 06/16/2006  . Family history of cancer of digestive system 06/16/2006  . 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 Essential hypertension 03/20/2004  . HLD (hyperlipidemia) 03/20/2002    Trotter,Margaret PT, DPT 08/04/2015, 5:26 PM  [place] MAIN [facility] SERVICES [address] [place], [place], [address] Phone: [phone]   Fax:  [phone]  Name: Jesus Mack MRN: [id number] Date of Birth: [birth year]

## 2015-08-09 ENCOUNTER — Ambulatory Visit: Payer: No Typology Code available for payment source | Admitting: Physical Therapy

## 2015-08-11 ENCOUNTER — Encounter: Payer: PRIVATE HEALTH INSURANCE | Admitting: Physical Therapy

## 2015-08-18 ENCOUNTER — Ambulatory Visit: Payer: No Typology Code available for payment source | Admitting: Physical Therapy

## 2015-08-29 ENCOUNTER — Other Ambulatory Visit: Payer: Self-pay | Admitting: Family Medicine

## 2015-12-27 ENCOUNTER — Telehealth: Payer: Self-pay | Admitting: Family Medicine

## 2015-12-27 NOTE — Telephone Encounter (Signed)
Pt called to schedule a F/U appt for back and hip pain. First available was 01/03/16 @ 145 pm. Pt wanted to see if he could come in this week. Can pt have one of the "Same Day" slots? Please advise. Thanks TNP

## 2015-12-27 NOTE — Telephone Encounter (Signed)
For review. KW 

## 2015-12-28 NOTE — Telephone Encounter (Signed)
Only time I could work him this week is 11:15 Friday.

## 2015-12-29 NOTE — Telephone Encounter (Signed)
Advised pt and rescheduled appt for Friday 12/31/15 @ 1115 am. Thanks TNP

## 2015-12-31 ENCOUNTER — Encounter: Payer: Self-pay | Admitting: Family Medicine

## 2015-12-31 ENCOUNTER — Ambulatory Visit (INDEPENDENT_AMBULATORY_CARE_PROVIDER_SITE_OTHER): Payer: PRIVATE HEALTH INSURANCE | Admitting: Family Medicine

## 2015-12-31 VITALS — BP 122/80 | HR 95 | Temp 98.1°F | Resp 16 | Wt 195.0 lb

## 2015-12-31 DIAGNOSIS — R29898 Other symptoms and signs involving the musculoskeletal system: Secondary | ICD-10-CM | POA: Diagnosis not present

## 2015-12-31 DIAGNOSIS — M5416 Radiculopathy, lumbar region: Secondary | ICD-10-CM | POA: Diagnosis not present

## 2015-12-31 MED ORDER — PREDNISONE 10 MG PO TABS: ORAL_TABLET | ORAL | 0 refills | Status: AC

## 2015-12-31 NOTE — Progress Notes (Signed)
Patient: Jesus Mack Male    DOB: 1955/09/06   60 y.o.   MRN: [id number] Visit Date: 12/31/2015  Today's Provider: Lelon Huh, MD   Chief Complaint  Patient presents with  . Back Pain   Subjective:    HPI Back pain: Patient was last seen for an evaluation of low back pain without sciatica on 06/25/2015. Patient was treated with Prednisone and referred to Physical Therapy for a month. Patient returns today stating his back pain flared up 1 month ago. He reports the Prednisone and Physical Therapy done at the last visit did help relieve his pain, but now the pain has returned. Patient has recently been taking Ibuprofen and other OTC pain relieves to help with pain. Has had a few minor flares off and on over the last 6 months which usually respond to back exercises, but flared up again about a month ago and getting progressively worse. Now having weakness in both legs with persistent pain into anterior legs. Has had no relief from OTC pain relievers and pain and weakness are beginning to impair his work.      No Known Allergies   Current Outpatient Prescriptions:  .  amLODipine (NORVASC) 10 MG tablet, TAKE 1 TABLET (10 MG TOTAL) BY MOUTH DAILY., Disp: 30 tablet, Rfl: 5 .  aspirin 81 MG tablet, , Disp: , Rfl:  .  lovastatin (MEVACOR) 40 MG tablet, TAKE 1 TABLET BY MOUTH EVERY DAY, Disp: 30 tablet, Rfl: 12 .  MULTIPLE VITAMIN PO, Take by mouth., Disp: , Rfl:   Review of Systems  Constitutional: Negative for appetite change, chills and fever.  Respiratory: Negative for chest tightness, shortness of breath and wheezing.   Cardiovascular: Negative for chest pain and palpitations.  Gastrointestinal: Negative for abdominal pain, nausea and vomiting.  Musculoskeletal: Positive for arthralgias (left leg), back pain (lower back) and myalgias (left leg).  Neurological: Positive for weakness (in both legs) and numbness (in left leg).    Social History  Substance Use Topics  .  Smoking status: Current Every Day Smoker    Packs/day: 1.00    Years: 30.00    Types: Cigarettes    Start date: 03/20/1973  . Smokeless tobacco: Never Used  . Alcohol use No   Objective:   BP 122/80 (BP Location: Right Arm, Patient Position: Sitting, Cuff Size: Large)   Pulse 95   Temp 98.1 F (36.7 C) (Oral)   Resp 16   Wt 195 lb (88.5 kg)   SpO2 95% Comment: room air  BMI 31.47 kg/m   Physical Exam   General Appearance:    Alert, cooperative, no distress  Eyes:    PERRL, conjunctiva/corneas clear, EOM's intact       Lungs:     Clear to auscultation bilaterally, respirations unlabored  Heart:    Regular rate and rhythm  Neurologic:   Awake, alert, oriented x 3. No apparent focal neurological           defect.   MS:   Point tenderness over mid lumbar spine and left Paraspinous muscles. Left leg extension diminished compared to right. DTR +1 both LEs.        Assessment & Plan:     1. Lumbar radiculopathy Recurrent despite 6 months of conservative therapy. Will start back on prednisone taper while awaiting MRI - predniSONE (DELTASONE) 10 MG tablet; 6 tablets for 2 days, then 5 for 2 days, then 4 for 2 days, then 3 for 2  days, then 2 for 2 days, then 1 for 2 days.  Dispense: 42 tablet; Refill: 0 - MR Lumbar Spine Wo Contrast; Future  2. Weakness of both lower extremities  - predniSONE (DELTASONE) 10 MG tablet; 6 tablets for 2 days, then 5 for 2 days, then 4 for 2 days, then 3 for 2 days, then 2 for 2 days, then 1 for 2 days.  Dispense: 42 tablet; Refill: 0 - MR Lumbar Spine Wo Contrast; Future       Lelon Huh, MD  [facility]

## 2016-01-03 ENCOUNTER — Ambulatory Visit: Payer: PRIVATE HEALTH INSURANCE | Admitting: Family Medicine

## 2016-02-25 ENCOUNTER — Other Ambulatory Visit: Payer: Self-pay | Admitting: Family Medicine

## 2016-05-09 ENCOUNTER — Ambulatory Visit (INDEPENDENT_AMBULATORY_CARE_PROVIDER_SITE_OTHER): Payer: 59 | Admitting: Family Medicine

## 2016-05-09 ENCOUNTER — Encounter: Payer: Self-pay | Admitting: Family Medicine

## 2016-05-09 VITALS — BP 132/84 | HR 75 | Temp 97.9°F | Resp 16 | Ht 67.5 in | Wt 198.0 lb

## 2016-05-09 DIAGNOSIS — Z0000 Encounter for general adult medical examination without abnormal findings: Secondary | ICD-10-CM

## 2016-05-09 DIAGNOSIS — Z1159 Encounter for screening for other viral diseases: Secondary | ICD-10-CM | POA: Diagnosis not present

## 2016-05-09 DIAGNOSIS — E785 Hyperlipidemia, unspecified: Secondary | ICD-10-CM

## 2016-05-09 DIAGNOSIS — Z23 Encounter for immunization: Secondary | ICD-10-CM

## 2016-05-09 DIAGNOSIS — F17210 Nicotine dependence, cigarettes, uncomplicated: Secondary | ICD-10-CM

## 2016-05-09 DIAGNOSIS — M5416 Radiculopathy, lumbar region: Secondary | ICD-10-CM

## 2016-05-09 DIAGNOSIS — E559 Vitamin D deficiency, unspecified: Secondary | ICD-10-CM

## 2016-05-09 DIAGNOSIS — I10 Essential (primary) hypertension: Secondary | ICD-10-CM

## 2016-05-09 MED ORDER — ASPIRIN 81 MG PO TABS: 81.0000 mg | ORAL_TABLET | Freq: Every day | ORAL | 3 refills | Status: DC

## 2016-05-09 MED ORDER — PREDNISONE 10 MG PO TABS: ORAL_TABLET | ORAL | 0 refills | Status: AC

## 2016-05-09 NOTE — Progress Notes (Signed)
Patient: Jesus Mack, Male    DOB: 06/08/1955, 61 y.o.   MRN: [id number] Visit Date: 05/09/2016  Today's Provider: Lelon Huh, MD   Chief Complaint  Patient presents with  . Annual Exam  . Hyperlipidemia    follow up  . Hypertension    follow up   Subjective:    Annual physical exam Jesus Mack is a 61 y.o. male who presents today for health maintenance and complete physical. He feels fairly well. He reports no regular exercise. He reports he is sleeping fairly well.  -----------------------------------------------------------------  Hypertension, follow-up:  BP Readings from Last 3 Encounters:  12/31/15 122/80  07/28/15 120/74  06/25/15 (!) 142/72    He was last seen for hypertension 9 months ago.  BP at that visit was 120/74. Management since that visit includes no changes. He reports good compliance with treatment. He is not having side effects.  He is not exercising. He is not adherent to low salt diet.   Outside blood pressures are checked occasionally. He is experiencing none.  Patient denies chest pain, chest pressure/discomfort, claudication, dyspnea, exertional chest pressure/discomfort, fatigue, irregular heart beat, lower extremity edema, near-syncope, orthopnea, palpitations, paroxysmal nocturnal dyspnea, syncope and tachypnea.   Cardiovascular risk factors include advanced age (older than 30 for men, 58 for women), dyslipidemia, hypertension and male gender.  Use of agents associated with hypertension: NSAIDS.     Weight trend: fluctuating a bit Wt Readings from Last 3 Encounters:  12/31/15 195 lb (88.5 kg)  07/28/15 198 lb (89.8 kg)  06/25/15 200 lb (90.7 kg)    Current diet: well balanced  ------------------------------------------------------------------------  Lipid/Cholesterol, Follow-up:   Last seen for this9 months ago.  Management changes since that visit include . [name] Last Lipid Panel:    Component Value  Date/Time   CHOL 183 07/28/2015 0923   TRIG 87 07/28/2015 0923   HDL 56 07/28/2015 0923   CHOLHDL 3.3 07/28/2015 0923   LDLCALC 110 (H) 07/28/2015 0923    Risk factors for vascular disease include hypercholesterolemia and hypertension  He reports good compliance with treatment. He is not having side effects.  Current symptoms include none and have been stable. Weight trend: fluctuating a bit Prior visit with dietician: no Current diet: well balanced Current exercise: none  Wt Readings from Last 3 Encounters:  12/31/15 195 lb (88.5 kg)  07/28/15 198 lb (89.8 kg)  06/25/15 200 lb (90.7 kg)    ------------------------------------------------------------------- Follow up Tobacco abuse: Patient was last seen for this problem 9 months ago and was advised to quit smoking. Since the last visit patient has not quit smoking.   Follow up Vitamin D Deficiency: Patient was last seen for this problem 9 months ago and no changes were made. Patient is is not currently taking any Vitamin D supplements. Ran out of vitamin d about a month ago.   Only complaint today is burning sensation in left anterior and lateral leg worse when sitting for the last month. He has had back pain off and on over the last year which responded to prednisone, but back is not bothering him now. LS spine from 09/03/14 revealed lumbar spinal stenosis.   Review of Systems  Constitutional: Negative for appetite change, chills, fatigue and fever.  HENT: Positive for drooling. Negative for congestion, ear pain, hearing loss, nosebleeds and trouble swallowing.   Eyes: Negative for pain and visual disturbance.  Respiratory: Negative for cough, chest tightness and shortness of breath.  Cardiovascular: Negative for chest pain, palpitations and leg swelling.  Gastrointestinal: Negative for abdominal pain, blood in stool, constipation, diarrhea, nausea and vomiting.  Endocrine: Positive for polyuria. Negative for polydipsia and  polyphagia.  Genitourinary: Negative for dysuria and flank pain.  Musculoskeletal: Positive for arthralgias and myalgias. Negative for back pain, joint swelling and neck stiffness.  Skin: Negative for color change, rash and wound.  Neurological: Positive for weakness and numbness. Negative for dizziness, tremors, seizures, speech difficulty, light-headedness and headaches.  Psychiatric/Behavioral: Negative for behavioral problems, confusion, decreased concentration, dysphoric mood and sleep disturbance. The patient is not nervous/anxious.   All other systems reviewed and are negative.   Social History      He  reports that he has been smoking Cigarettes.  He started smoking about 43 years ago. He has a 30.00 pack-year smoking history. He has never used smokeless tobacco. He reports that he does not drink alcohol or use drugs.       Social History   Social History  . Marital status: Married    Spouse name: N/A  . Number of children: 2  . Years of education: N/A   Occupational History  . Full-Time    Social History Main Topics  . Smoking status: Current Every Day Smoker    Packs/day: 1.00    Years: 30.00    Types: Cigarettes    Start date: 03/20/1973  . Smokeless tobacco: Never Used  . Alcohol use No  . Drug use: No  . Sexual activity: Not Asked   Other Topics Concern  . None   Social History Narrative  . None    Past Medical History:  Diagnosis Date  . Hypertension    controlled with medication;      Patient Active Problem List   Diagnosis Date Noted  . Lower back pain 06/18/2015  . Iliotibial band syndrome 06/18/2015  . Gastroesophageal reflux disease 09/03/2014  . Arthritis, degenerative 09/03/2014  . Arthritis of knee, degenerative 09/03/2014  . Vitamin D deficiency 04/30/2009  . Difficulty staying awake 07/07/2008  . Family history of malignant neoplasm of prostate 06/10/2007  . Tobacco abuse 06/10/2007  . Family history of cardiovascular disease 06/16/2006   . Family history of cancer of digestive system 06/16/2006  . 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 Essential hypertension 03/20/2004  . HLD (hyperlipidemia) 03/20/2002    Past Surgical History:  Procedure Laterality Date  . ACNE CYST REMOVAL Right    Shoulder  . KIDNEY STONE SURGERY     Blasted    Family History        Family Status  Relation Status  . Mother Deceased  . Father Alive  . Brother Deceased        His family history includes Colon cancer in his brother and mother; Heart disease in his mother; Hypertension in his father.     No Known Allergies   Current Outpatient Prescriptions:  .  amLODipine (NORVASC) 10 MG tablet, TAKE 1 TABLET (10 MG TOTAL) BY MOUTH DAILY., Disp: 30 tablet, Rfl: 5 .  aspirin 81 MG tablet, , Disp: , Rfl:  .  lovastatin (MEVACOR) 40 MG tablet, TAKE 1 TABLET BY MOUTH EVERY DAY, Disp: 30 tablet, Rfl: 12   Patient Care Team: Birdie Sons, MD as PCP - General (Family Medicine)      Objective:   Vitals: BP 132/84 (BP Location: Left Arm, Patient Position: Sitting, Cuff Size: Large)   Pulse 75   Temp 97.9 F (36.6 C) (  Oral)   Resp 16   Ht 5' 7.5" (1.715 m)   Wt 198 lb (89.8 kg)   SpO2 95% Comment: room air  BMI 30.55 kg/m    Physical Exam   General Appearance:    Alert, cooperative, no distress, appears stated age  Head:    Normocephalic, without obvious abnormality, atraumatic  Eyes:    PERRL, conjunctiva/corneas clear, EOM's intact, fundi    benign, both eyes       Ears:    Normal TM's and external ear canals, both ears  Nose:   Nares normal, septum midline, mucosa normal, no drainage   or sinus tenderness  Throat:   Lips, mucosa, and tongue normal; teeth and gums normal  Neck:   Supple, symmetrical, trachea midline, no adenopathy;       thyroid:  No enlargement/tenderness/nodules; no carotid   bruit or JVD  Back:     Symmetric, no curvature, ROM normal, no CVA tenderness  Lungs:     Clear to auscultation bilaterally,  respirations unlabored  Chest wall:    No tenderness or deformity  Heart:    Regular rate and rhythm, S1 and S2 normal, no murmur, rub   or gallop  Abdomen:     Soft, non-tender, bowel sounds active all four quadrants,    no masses, no organomegaly  Genitalia:    deferred  Rectal:    deferred  Extremities:   Extremities normal, atraumatic, no cyanosis or edema  Pulses:   2+ and symmetric all extremities  Skin:   Skin color, texture, turgor normal, no rashes or lesions  Lymph nodes:   Cervical, supraclavicular, and axillary nodes normal  Neurologic:   CNII-XII intact. Normal strength, sensation and reflexes      Throughout. Positive left straight leg raise.      Depression Screen PHQ 2/9 Scores 05/09/2016  PHQ - 2 Score 1  PHQ- 9 Score 2      Assessment & Plan:     Routine Health Maintenance and Physical Exam  Exercise Activities and Dietary recommendations Goals    None       There is no immunization history on file for this patient.  Health Maintenance  Topic Date Due  . Hepatitis C Screening  [birth year]  . TETANUS/TDAP  09/10/1974  . INFLUENZA VACCINE  10/19/2015  . COLONOSCOPY  03/12/2023  . HIV Screening  Completed     Discussed health benefits of physical activity, and encouraged him to engage in regular exercise appropriate for his age and condition.    -------------------------------------------------------------------- 1. Annual physical exam   2. Essential hypertension Well controlled.  Continue current medications.    3. Hyperlipidemia, unspecified hyperlipidemia type He is tolerating lovastatin well with no adverse effects.   - Lipid panel - Comprehensive metabolic panel  4. Vitamin D deficiency Off vitamin d for the last month - VITAMIN D 25 Hydroxy (Vit-D Deficiency, Fractures)  5. Need for hepatitis C screening test  - Hepatitis C antibody  6. Need for Tdap vaccination  - Tdap vaccine greater than or equal to 7yo IM  7. Lumbar  radiculopathy No back pain with this episode, but responded well to prednisone in the past. Will start 12 day taper. Call for PT referral if not resolved on prednisone or if symptoms return immediately afterwor.   8. Smoking greater than 30 pack years  - CT CHEST LUNG CA SCREEN LOW DOSE W/O CM; Future    Lelon Huh, MD  [facility]

## 2016-05-10 ENCOUNTER — Telehealth: Payer: Self-pay

## 2016-05-10 LAB — COMPREHENSIVE METABOLIC PANEL
ALK PHOS: 70 IU/L (ref 39–117)
ALT: 13 IU/L (ref 0–44)
AST: 15 IU/L (ref 0–40)
Albumin/Globulin Ratio: 1.5 (ref 1.2–2.2)
Albumin: 4.5 g/dL (ref 3.6–4.8)
BILIRUBIN TOTAL: 0.4 mg/dL (ref 0.0–1.2)
BUN/Creatinine Ratio: 15 (ref 10–24)
BUN: 15 mg/dL (ref 8–27)
CO2: 22 mmol/L (ref 18–29)
CREATININE: 0.99 mg/dL (ref 0.76–1.27)
Calcium: 9.6 mg/dL (ref 8.6–10.2)
Chloride: 98 mmol/L (ref 96–106)
GFR calc Af Amer: 95 (ref >59)
GFR calc non Af Amer: 82 (ref >59)
GLOBULIN, TOTAL: 3 (ref 1.5–4.5)
GLUCOSE: 86 mg/dL (ref 65–99)
Potassium: 4.7 mmol/L (ref 3.5–5.2)
SODIUM: 139 mmol/L (ref 134–144)
Total Protein: 7.5 g/dL (ref 6.0–8.5)

## 2016-05-10 LAB — VITAMIN D 25 HYDROXY (VIT D DEFICIENCY, FRACTURES): Vit D, 25-Hydroxy: 15.5 ng/mL — ABNORMAL LOW (ref 30.0–100.0)

## 2016-05-10 LAB — LIPID PANEL
CHOL/HDL RATIO: 3.1 (ref 0.0–5.0)
Cholesterol, Total: 160 mg/dL (ref 100–199)
HDL: 51 mg/dL (ref >39)
LDL CALC: 89 (ref 0–99)
Triglycerides: 100 mg/dL (ref 0–149)
VLDL CHOLESTEROL CAL: 20 (ref 5–40)

## 2016-05-10 LAB — HEPATITIS C ANTIBODY: Hep C Virus Ab: <0.1 s/co ratio (ref 0.0–0.9)

## 2016-05-10 NOTE — Telephone Encounter (Signed)
LMTCB ED 

## 2016-05-10 NOTE — Telephone Encounter (Signed)
-----   Message from Birdie Sons, MD sent at 05/10/2016  7:42 AM EST ----- Vitamin d level is very low, otherwise labs are very good. Start back on daily vitamin d supplement, continue all other medications. If pains in legs not resolved by the time he finishes prednisone he will need to call for referral to physical therapy. [name]

## 2016-05-11 ENCOUNTER — Telehealth: Payer: Self-pay | Admitting: *Deleted

## 2016-05-11 NOTE — Telephone Encounter (Signed)
LMOVM for pt to return call 

## 2016-05-11 NOTE — Telephone Encounter (Signed)
Received referral for low dose lung cancer screening CT scan. Voicemail left at phone number listed in EMR for patient to call me back to facilitate scheduling scan.  

## 2016-05-30 ENCOUNTER — Telehealth: Payer: Self-pay | Admitting: *Deleted

## 2016-05-30 NOTE — Telephone Encounter (Signed)
error 

## 2016-05-30 NOTE — Telephone Encounter (Signed)
Received referral for low dose lung cancer screening CT scan. Voicemail left at phone number listed in EMR for patient to call me back to facilitate scheduling scan.  

## 2016-06-12 ENCOUNTER — Encounter: Payer: Self-pay | Admitting: *Deleted

## 2016-08-05 ENCOUNTER — Other Ambulatory Visit: Payer: Self-pay | Admitting: Family Medicine

## 2016-09-05 ENCOUNTER — Other Ambulatory Visit: Payer: Self-pay | Admitting: Family Medicine

## 2017-02-14 ENCOUNTER — Encounter: Payer: Self-pay | Admitting: Family Medicine

## 2017-02-14 ENCOUNTER — Ambulatory Visit: Payer: 59 | Admitting: Family Medicine

## 2017-02-14 VITALS — BP 110/60 | HR 78 | Temp 98.6°F | Resp 16 | Ht 68.0 in | Wt 204.0 lb

## 2017-02-14 DIAGNOSIS — R590 Localized enlarged lymph nodes: Secondary | ICD-10-CM | POA: Diagnosis not present

## 2017-02-14 DIAGNOSIS — L84 Corns and callosities: Secondary | ICD-10-CM | POA: Diagnosis not present

## 2017-02-14 MED ORDER — AMOXICILLIN-POT CLAVULANATE 875-125 MG PO TABS: 1.0000 | ORAL_TABLET | Freq: Two times a day (BID) | ORAL | 0 refills | Status: AC

## 2017-02-14 MED ORDER — TRIAMCINOLONE ACETONIDE 0.1 % EX CREA: TOPICAL_CREAM | CUTANEOUS | 1 refills | Status: AC

## 2017-02-14 NOTE — Progress Notes (Signed)
Patient: Jesus Mack Male    DOB: [birth year]   61 y.o.   MRN: [id number] Visit Date: 02/14/2017  Today's Provider: Lelon Huh, MD   Chief Complaint  Patient presents with  . Otalgia  . Foot Pain   Subjective:    Patient has had pain around the outside of left ear for 2 months. Patient states the pain waxes and wanes. Pain is described as dull. If he lays on left side when sleeping, the pain is intense. Also patient has 2 growths on his left foot for about a year. Has one growth on big toe and one on the bottom. Growths will come-up on his foot and grow to a certain size and then fall off. Patient states the growth on the bottom of his foot feels like a nail sticking in the skin when he walks on it for a while.       No Known Allergies   Current Outpatient Medications:  .  amLODipine (NORVASC) 10 MG tablet, TAKE 1 TABLET (10 MG TOTAL) BY MOUTH DAILY., Disp: 30 tablet, Rfl: 12 .  aspirin 81 MG tablet, Take 1 tablet (81 mg total) by mouth daily., Disp: 100 tablet, Rfl: 3 .  lovastatin (MEVACOR) 40 MG tablet, TAKE 1 TABLET BY MOUTH EVERY DAY, Disp: 30 tablet, Rfl: 11  Review of Systems  Constitutional: Negative for appetite change, chills and fever.  Respiratory: Negative for chest tightness, shortness of breath and wheezing.   Cardiovascular: Negative for chest pain and palpitations.  Gastrointestinal: Negative for nausea.    Social History   Tobacco Use  . Smoking status: Current Every Day Smoker    Packs/day: 1.00    Years: 45.00    Pack years: 45.00    Types: Cigarettes    Start date: 03/20/1973  . Smokeless tobacco: Never Used  . Tobacco comment: Started about age 61, 1 ppd  Substance Use Topics  . Alcohol use: No    Alcohol/week: 0.0 oz   Objective:   BP 110/60 (BP Location: Right Arm, Patient Position: Sitting, Cuff Size: Large)   Pulse 78   Temp 98.6 F (37 C) (Oral)   Resp 16   Ht 5\' 8"  (1.727 m)   Wt 204 lb (92.5 kg)   SpO2 95%   BMI  31.02 kg/m  Vitals:   02/14/17 0956  BP: 110/60  Pulse: 78  Resp: 16  Temp: 98.6 F (37 C)  TempSrc: Oral  SpO2: 95%  Weight: 204 lb (92.5 kg)  Height: 5\' 8"  (1.727 m)     Physical Exam  General Appearance:    Alert, cooperative, no distress  HENT:   bilateral TM normal without fluid or infection, throat normal without erythema or exudate. Tender and swollen anterior to left ear. No erythema.   Eyes:    PERRL, conjunctiva/corneas clear, EOM's intact       Ext:   Callus on plantar aspect of left foot and toe.. Tender to touch, not inflamed.           Assessment & Plan:     1. Lymphadenopathy, periauricular New problem.  - amoxicillin-clavulanate (AUGMENTIN) 875-125 MG tablet; Take 1 tablet by mouth 2 (two) times daily for 14 days.  Dispense: 28 tablet; Refill: 0  Recheck in two weeks.  Call if symptoms change or worsen.   2. Callus of foot New problem.  - Ambulatory referral to [place] [place]  [facility]

## 2017-02-22 ENCOUNTER — Encounter: Payer: Self-pay | Admitting: Podiatry

## 2017-02-22 ENCOUNTER — Ambulatory Visit (INDEPENDENT_AMBULATORY_CARE_PROVIDER_SITE_OTHER): Payer: 59 | Admitting: Podiatry

## 2017-02-22 VITALS — BP 166/107 | HR 99

## 2017-02-22 DIAGNOSIS — M205X2 Other deformities of toe(s) (acquired), left foot: Secondary | ICD-10-CM

## 2017-02-22 DIAGNOSIS — Q828 Other specified congenital malformations of skin: Secondary | ICD-10-CM | POA: Diagnosis not present

## 2017-02-22 NOTE — Progress Notes (Signed)
   Subjective:    Patient ID: Jesus Mack, male    DOB: 06/02/1955, 61 y.o.   MRN: [id number]  HPI this patient presents the office with chief complaint of painful calluses on the bottom of his left foot.  He says that he has had these calluses for over a year.  He says they come and go but are painful after a days activity at work.  He says the corn on the left great toe has been present for a year but the corn through the arch has been present for 3-4 months.  He presents the office today for an evaluation and treatment of these painful callus    Review of Systems  HENT: Positive for ear pain.        Objective:   Physical Exam General Appearance  Alert, conversant and in no acute stress.  Vascular  Dorsalis pedis and posterior pulses are palpable  bilaterally.  Capillary return is within normal limits  Bilaterally. Temperature is within normal limits  Bilaterally  Neurologic  Senn-Weinstein monofilament wire test within normal limits  bilaterally. Muscle power  Within normal limits bilaterally.  Nails normal nails noted with no evidence of any bacterial or fungal infection  Orthopedic  No limitations of motion of motion feet bilaterally.  No crepitus or effusions noted.  No bony pathology or digital deformities noted. 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  Skin  normotropic skin.    No signs of infections or ulcers noted. Porokeratosis left hallux medial border and sub-fifth meta-base left foot.        Assessment & Plan:  Porokeratosis left foot.  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  IE  Debrided porokeratosis.  Discussed treatment of the poor keratoses and provided conservative care today.  Told him we can consider surgical excision of these porokeratosis in the future if the problem persists.  RTC prn   Gardiner Barefoot DPM

## 2017-02-28 ENCOUNTER — Ambulatory Visit: Payer: Self-pay | Admitting: Family Medicine

## 2017-03-02 NOTE — Progress Notes (Signed)
       Patient: Jesus Mack Male    DOB: 1955/04/06   61 y.o.   MRN: [id number] Visit Date: 03/02/2017  Today's Provider: Lelon Huh, MD   Chief Complaint  Patient presents with  . Ear Pain    follow up   Subjective:    HPI  Lymphadenopathy, periauricular From 02/14/2017-prescribed amoxicillin-clavulanate (AUGMENTIN) 875-125 MG tablet; Take 1 tablet by mouth 2 (two) times daily for 14 days. Today patient comes in reporting good compliance with treatment, good tolerance and good symptom control. Patient took the last pill last weeks and states he feels much better.   No Known Allergies   Current Outpatient Medications:  .  amLODipine (NORVASC) 10 MG tablet, TAKE 1 TABLET (10 MG TOTAL) BY MOUTH DAILY., Disp: 30 tablet, Rfl: 12 .  aspirin 81 MG tablet, Take 1 tablet (81 mg total) by mouth daily., Disp: 100 tablet, Rfl: 3 .  lovastatin (MEVACOR) 40 MG tablet, TAKE 1 TABLET BY MOUTH EVERY DAY, Disp: 30 tablet, Rfl: 11 .  triamcinolone cream (KENALOG) 0.1 %, Apply sparingly to affected area twice daily as needed, Disp: 30 g, Rfl: 1  Review of Systems  Constitutional: Negative for appetite change, chills and fever.  Respiratory: Negative for chest tightness, shortness of breath and wheezing.   Cardiovascular: Negative for chest pain and palpitations.  Gastrointestinal: Negative for abdominal pain, nausea and vomiting.    Social History   Tobacco Use  . Smoking status: Current Every Day Smoker    Packs/day: 1.00    Years: 45.00    Pack years: 45.00    Types: Cigarettes    Start date: 03/20/1973  . Smokeless tobacco: Never Used  . Tobacco comment: Started about age 39, 1 ppd  Substance Use Topics  . Alcohol use: No    Alcohol/week: 0.0 oz   Objective:   BP 122/78 (BP Location: Left Arm, Patient Position: Sitting, Cuff Size: Large)   Pulse 78   Temp 97.8 F (36.6 C) (Oral)   Resp 16   Wt 204 lb (92.5 kg)   BMI 31.02 kg/m  There were no vitals filed for this  visit.   Physical Exam  General Appearance:    Alert, cooperative, no distress  HENT:   ENT exam normal, no neck nodes or sinus tenderness  Eyes:    PERRL, conjunctiva/corneas clear, EOM's intact               Assessment & Plan:     1. Lymphadenopathy, periauricular Resolved. Call if symptoms return. If so will refer ENT  2. Need for shingles vaccine Shingrix #1 given today.        Lelon Huh, MD  [facility]

## 2017-03-05 ENCOUNTER — Ambulatory Visit: Payer: 59 | Admitting: Family Medicine

## 2017-03-05 ENCOUNTER — Encounter: Payer: Self-pay | Admitting: Family Medicine

## 2017-03-05 VITALS — BP 122/78 | HR 78 | Temp 97.8°F | Resp 16 | Wt 204.0 lb

## 2017-03-05 DIAGNOSIS — Z23 Encounter for immunization: Secondary | ICD-10-CM

## 2017-03-05 DIAGNOSIS — R590 Localized enlarged lymph nodes: Secondary | ICD-10-CM | POA: Diagnosis not present

## 2017-05-14 ENCOUNTER — Ambulatory Visit (INDEPENDENT_AMBULATORY_CARE_PROVIDER_SITE_OTHER): Payer: 59 | Admitting: Family Medicine

## 2017-05-14 DIAGNOSIS — Z23 Encounter for immunization: Secondary | ICD-10-CM | POA: Diagnosis not present

## 2017-05-14 NOTE — Progress Notes (Signed)
Administered with 25g 1in needle. Patient tolerated well with no adverse reactions.

## 2017-05-17 ENCOUNTER — Encounter: Payer: Self-pay | Admitting: Family Medicine

## 2017-05-17 ENCOUNTER — Ambulatory Visit: Payer: 59 | Admitting: Family Medicine

## 2017-05-17 VITALS — BP 138/80 | HR 104 | Temp 99.2°F | Resp 20 | Wt 200.0 lb

## 2017-05-17 DIAGNOSIS — R059 Cough, unspecified: Secondary | ICD-10-CM

## 2017-05-17 DIAGNOSIS — R05 Cough: Secondary | ICD-10-CM

## 2017-05-17 DIAGNOSIS — J029 Acute pharyngitis, unspecified: Secondary | ICD-10-CM | POA: Diagnosis not present

## 2017-05-17 DIAGNOSIS — J069 Acute upper respiratory infection, unspecified: Secondary | ICD-10-CM

## 2017-05-17 LAB — POCT INFLUENZA A/B
INFLUENZA A, POC: NEGATIVE
INFLUENZA B, POC: NEGATIVE

## 2017-05-17 LAB — POCT RAPID STREP A (OFFICE): RAPID STREP A SCREEN: NEGATIVE

## 2017-05-17 MED ORDER — DOXYCYCLINE HYCLATE 100 MG PO TABS: 100.0000 mg | ORAL_TABLET | Freq: Two times a day (BID) | ORAL | 0 refills | Status: AC

## 2017-05-17 NOTE — Progress Notes (Signed)
Patient: Jesus Mack Male    DOB: Mar 23, 1955   62 y.o.   MRN: [id number] Visit Date: 05/17/2017  Today's Provider: Lelon Huh, MD   Chief Complaint  Patient presents with  . URI   Subjective:    Sore Throat   This is a new problem. Episode onset: 2 days ago. The problem has been gradually worsening. Neither side of throat is experiencing more pain than the other. There has been no fever. Associated symptoms include congestion (chest and sinus congestion), coughing (mostly dry cough) and a hoarse voice. Pertinent negatives include no abdominal pain, ear discharge, ear pain, headaches, shortness of breath, trouble swallowing or vomiting. Associated symptoms comments: Also body aches, chills, sweats and dry cough. He has had no exposure to strep or mono. Treatments tried: Mucinex and NyQuil. The treatment provided mild relief.  Patient thinks he may have had a fever, but did not check his temperature.     No Known Allergies   Current Outpatient Medications:  .  amLODipine (NORVASC) 10 MG tablet, TAKE 1 TABLET (10 MG TOTAL) BY MOUTH DAILY., Disp: 30 tablet, Rfl: 12 .  aspirin 81 MG tablet, Take 1 tablet (81 mg total) by mouth daily., Disp: 100 tablet, Rfl: 3 .  lovastatin (MEVACOR) 40 MG tablet, TAKE 1 TABLET BY MOUTH EVERY DAY, Disp: 30 tablet, Rfl: 11 .  Multiple Vitamin (MULTIVITAMIN) tablet, Take 1 tablet by mouth daily., Disp: , Rfl:  .  triamcinolone cream (KENALOG) 0.1 %, Apply sparingly to affected area twice daily as needed, Disp: 30 g, Rfl: 1  Review of Systems  Constitutional: Positive for chills, diaphoresis and fatigue. Negative for appetite change and fever.  HENT: Positive for congestion (chest and sinus congestion), hoarse voice, sinus pressure and sore throat. Negative for ear discharge, ear pain, rhinorrhea and trouble swallowing.   Respiratory: Positive for cough (mostly dry cough). Negative for chest tightness, shortness of breath and wheezing.     Cardiovascular: Positive for chest pain (when coughing). Negative for palpitations.  Gastrointestinal: Negative for abdominal pain, nausea and vomiting.  Neurological: Negative for headaches.    Social History   Tobacco Use  . Smoking status: Current Every Day Smoker    Packs/day: 1.00    Years: 45.00    Pack years: 45.00    Types: Cigarettes    Start date: 03/20/1973  . Smokeless tobacco: Never Used  . Tobacco comment: Started about age 34, 1 ppd  Substance Use Topics  . Alcohol use: No    Alcohol/week: 0.0 oz   Objective:   BP 138/80 (BP Location: Left Arm, Patient Position: Sitting, Cuff Size: Large)   Pulse (!) 104   Temp 99.2 F (37.3 C) (Oral)   Resp 20   Wt 200 lb (90.7 kg)   SpO2 95% Comment: room air  BMI 30.41 kg/m  There were no vitals filed for this visit.   Physical Exam  General Appearance:    Alert, cooperative, no distress  HENT:   bilateral TM normal without fluid or infection, neck has bilateral anterior cervical nodes enlarged, pharynx erythematous without exudate, sinuses nontender and nasal mucosa pale and congested  Eyes:    PERRL, conjunctiva/corneas clear, EOM's intact       Lungs:     Clear to auscultation bilaterally, respirations unlabored  Heart:    Regular rate and rhythm  Neurologic:   Awake, alert, oriented x 3. No apparent focal neurological  defect.       Flu A - Negative Flu B - Negative Rapid Strep Negative.      Assessment & Plan:     1. Sore throat  - POCT rapid strep A  2. Cough  - POCT Influenza A/B  3. Upper respiratory tract infection, unspecified type  - doxycycline (VIBRA-TABS) 100 MG tablet; Take 1 tablet (100 mg total) by mouth 2 (two) times daily for 7 days.  Dispense: 14 tablet; Refill: 0  Call if symptoms change or if not rapidly improving.         Lelon Huh, MD  [facility]

## 2017-06-04 ENCOUNTER — Other Ambulatory Visit: Payer: Self-pay | Admitting: Family Medicine

## 2017-07-06 ENCOUNTER — Ambulatory Visit: Payer: 59 | Admitting: Family Medicine

## 2017-07-06 ENCOUNTER — Encounter: Payer: Self-pay | Admitting: Family Medicine

## 2017-07-06 VITALS — BP 130/78 | HR 76 | Temp 97.8°F | Resp 16

## 2017-07-06 DIAGNOSIS — K625 Hemorrhage of anus and rectum: Secondary | ICD-10-CM

## 2017-07-06 DIAGNOSIS — Z800 Family history of malignant neoplasm of digestive organs: Secondary | ICD-10-CM | POA: Diagnosis not present

## 2017-07-06 NOTE — Progress Notes (Signed)
       Patient: Jesus Mack Male    DOB: Jul 31, 1955   62 y.o.   MRN: [id number] Visit Date: 07/06/2017  Today's Provider: Lelon Huh, MD   Chief Complaint  Patient presents with  . Rectal Bleeding   Subjective:    HPI Blood in stools: Patient comes in reporting that in the past week he has had 2 episodes of rectal bleeding. The latest episode was yesterday. Patient states yesterday after having a bowel movement he saw bright red blood to the tissue while wiping. There has been no rectal or abdominal pin. Patient does has a family history of colon cancer in his mother and brother. Last colonoscopy was in December 2014 by Dr. Allen Norris with excision of multiple hyperplastic polyps.     No Known Allergies   Current Outpatient Medications:  .  amLODipine (NORVASC) 10 MG tablet, TAKE 1 TABLET (10 MG TOTAL) BY MOUTH DAILY., Disp: 30 tablet, Rfl: 12 .  aspirin 81 MG EC tablet, TAKE 1 TABLET (81 MG TOTAL) BY MOUTH DAILY., Disp: 100 tablet, Rfl: 3 .  lovastatin (MEVACOR) 40 MG tablet, TAKE 1 TABLET BY MOUTH EVERY DAY, Disp: 30 tablet, Rfl: 11 .  Multiple Vitamin (MULTIVITAMIN) tablet, Take 1 tablet by mouth daily., Disp: , Rfl:  .  triamcinolone cream (KENALOG) 0.1 %, Apply sparingly to affected area twice daily as needed, Disp: 30 g, Rfl: 1  Review of Systems  Constitutional: Negative for appetite change and chills.  Respiratory: Negative for chest tightness, shortness of breath and wheezing.   Cardiovascular: Negative for palpitations.  Gastrointestinal: Positive for blood in stool. Negative for abdominal distention, abdominal pain, constipation and diarrhea.    Social History   Tobacco Use  . Smoking status: Current Every Day Smoker    Packs/day: 1.00    Years: 45.00    Pack years: 45.00    Types: Cigarettes    Start date: 03/20/1973  . Smokeless tobacco: Never Used  . Tobacco comment: Started about age 32, 1 ppd  Substance Use Topics  . Alcohol use: No    Alcohol/week:  0.0 oz   Objective:   BP 130/78 (BP Location: Right Arm, Patient Position: Sitting, Cuff Size: Large)   Pulse 76   Temp 97.8 F (36.6 C) (Oral)   Resp 16   SpO2 95% Comment: room air    Physical Exam  General appearance: alert, well developed, well nourished, cooperative and in no distress Head: Normocephalic, without obvious abnormality, atraumatic Respiratory: Respirations even and unlabored, normal respiratory rate Extremities: No gross deformities Skin: Skin color, texture, turgor normal. No rashes seen  Psych: Appropriate mood and affect. Neurologic: Mental status: Alert, oriented to person, place, and time, thought content appropriate.     Assessment & Plan:     1. Rectal bleeding   2. Family history of cancer of digestive system   Has been nearly 5 years since last colonoscopy. Will refer for further evaluation.        Lelon Huh, MD  [facility]

## 2017-07-10 ENCOUNTER — Ambulatory Visit: Payer: PRIVATE HEALTH INSURANCE | Admitting: Gastroenterology

## 2017-07-10 ENCOUNTER — Encounter: Payer: Self-pay | Admitting: Gastroenterology

## 2017-07-10 VITALS — BP 119/67 | HR 84 | Ht 66.0 in | Wt 200.4 lb

## 2017-07-10 DIAGNOSIS — K625 Hemorrhage of anus and rectum: Secondary | ICD-10-CM

## 2017-07-10 NOTE — Progress Notes (Signed)
Jonathon Bellows MD, MRCP(U.K) [address]  [place]  [place], [place] [address]  Main: [phone]  Fax: [phone]   Gastroenterology Consultation  Referring Provider:     Birdie Sons, MD Primary Care Physician:  Birdie Sons, MD Primary Gastroenterologist:  Dr. Jonathon Bellows  Reason for Consultation:     Rectal bleeding         HPI:   Jesus Mack is a 62 y.o. y/o male referred for consultation & management  by Dr. Birdie Sons, MD.    Rectal bleeding :  Onset and where was blood seen  :2 weeks back, occurred on two occasions. Dark red blood. Noticed a small amount on the tissue paper and his underpants Frequency of bowel movements :he has a bowel movement daily  Consistency : soft  Change in shape of stool:no  Pain associated with bowel movements:no  Blood thinner usage:none was on asprin  NSAID's: no  Prior colonoscopy :last colonoscopy 5 years back - he did have polyps  Family history of colon cancer or polyps:yes - brother had colon cancer  Weight loss:no    Past Medical History:  Diagnosis Date  . Hypertension    controlled with medication;     Past Surgical History:  Procedure Laterality Date  . ACNE CYST REMOVAL Right    Shoulder  . KIDNEY STONE SURGERY     Blasted    Prior to Admission medications   Medication Sig Start Date End Date Taking? Authorizing Provider  amLODipine (NORVASC) 10 MG tablet TAKE 1 TABLET (10 MG TOTAL) BY MOUTH DAILY. 09/05/16  Yes Birdie Sons, MD  lovastatin (MEVACOR) 40 MG tablet TAKE 1 TABLET BY MOUTH EVERY DAY 08/05/16  Yes Birdie Sons, MD  Multiple Vitamin (MULTIVITAMIN) tablet Take 1 tablet by mouth daily.   Yes [provider]  triamcinolone cream (KENALOG) 0.1 % Apply sparingly to affected area twice daily as needed 02/14/17  Yes Birdie Sons, MD  aspirin 81 MG EC tablet  07/07/17   [provider]    Family History  Problem Relation Age of Onset  . Colon cancer Mother    . Heart disease Mother   . Hypertension Father   . Colon cancer Brother      Social History   Tobacco Use  . Smoking status: Former Smoker    Packs/day: 1.00    Years: 45.00    Pack years: 45.00    Types: Cigarettes    Start date: 03/20/1973    Last attempt to quit: 06/26/2017    Years since quitting: 0.0  . Smokeless tobacco: Never Used  . Tobacco comment: recently quit  Substance Use Topics  . Alcohol use: No    Alcohol/week: 0.0 oz  . Drug use: No    Allergies as of 07/10/2017  . (No Known Allergies)    Review of Systems:    All systems reviewed and negative except where noted in HPI.   Physical Exam:  BP 119/67   Pulse 84   Ht 5\' 6"  (1.676 m)   Wt 200 lb 6.4 oz (90.9 kg)   BMI 32.35 kg/m  No LMP for male patient. Psych:  Alert and cooperative. Normal mood and affect. General:   Alert,  Well-developed, well-nourished, pleasant and cooperative in NAD Head:  Normocephalic and atraumatic. Eyes:  Sclera clear, no icterus.   Conjunctiva pink. Ears:  Normal auditory acuity. Nose:  No deformity, discharge, or lesions. Mouth:  No deformity  or lesions,oropharynx pink & moist. Neck:  Supple; no masses or thyromegaly. Lungs:  Respirations even and unlabored.  Clear throughout to auscultation.   No wheezes, crackles, or rhonchi. No acute distress. Heart:  Regular rate and rhythm; no murmurs, clicks, rubs, or gallops. Abdomen:  Normal bowel sounds.  No bruits.  Soft, non-tender and non-distended without masses, hepatosplenomegaly or hernias noted.  No guarding or rebound tenderness.    Msk:  Symmetrical without gross deformities. Good, equal movement & strength bilaterally. Pulses:  Normal pulses noted. Extremities:  No clubbing or edema.  No cyanosis. Neurologic:  Alert and oriented x3;  grossly normal neurologically. Skin:  Intact without significant lesions or rashes. No jaundice. Lymph Nodes:  No significant cervical adenopathy. Psych:  Alert and cooperative. Normal  mood and affect.  Imaging Studies: No results found.  Assessment and Plan:   Jesus Mack is a 62 y.o. y/o male has been referred for rectal bleeding . Family history of colon  Cancer.   Plan  1. Colonoscopy    I have discussed alternative options, risks & benefits,  which include, but are not limited to, bleeding, infection, perforation,respiratory complication & drug reaction.  The patient agrees with this plan & written consent will be obtained.     Follow up PRN  Dr Jonathon Bellows MD,MRCP(U.K)

## 2017-07-11 ENCOUNTER — Other Ambulatory Visit: Payer: Self-pay

## 2017-07-11 DIAGNOSIS — K625 Hemorrhage of anus and rectum: Secondary | ICD-10-CM

## 2017-07-11 MED ORDER — PEG 3350-KCL-NA BICARB-NACL 420 G PO SOLR: 4000.0000 mL | Freq: Once | ORAL | 0 refills | Status: AC

## 2017-07-11 NOTE — Addendum Note (Signed)
Addended by: Leontine Locket Z on: 07/11/2017 11:01 AM   Modules accepted: Orders, SmartSet

## 2017-07-19 ENCOUNTER — Encounter
Admission: RE | Disposition: A | Discharge status: Home / Self Care | Payer: Self-pay | Source: Ambulatory Visit | Attending: Gastroenterology

## 2017-07-19 ENCOUNTER — Ambulatory Visit: Payer: Commercial Managed Care - HMO | Admitting: Anesthesiology

## 2017-07-19 ENCOUNTER — Ambulatory Visit
Admission: RE | Admit: 2017-07-19 | Discharge: 2017-07-19 | Disposition: A | Discharge status: Home / Self Care | Payer: Commercial Managed Care - HMO | Source: Ambulatory Visit | Attending: Gastroenterology | Admitting: Gastroenterology

## 2017-07-19 ENCOUNTER — Encounter: Payer: Self-pay | Admitting: Certified Registered Nurse Anesthetist

## 2017-07-19 DIAGNOSIS — Z800 Family history of malignant neoplasm of digestive organs: Secondary | ICD-10-CM | POA: Insufficient documentation

## 2017-07-19 DIAGNOSIS — Z8249 Family history of ischemic heart disease and other diseases of the circulatory system: Secondary | ICD-10-CM | POA: Diagnosis not present

## 2017-07-19 DIAGNOSIS — K640 First degree hemorrhoids: Secondary | ICD-10-CM | POA: Insufficient documentation

## 2017-07-19 DIAGNOSIS — I10 Essential (primary) hypertension: Secondary | ICD-10-CM | POA: Insufficient documentation

## 2017-07-19 DIAGNOSIS — K625 Hemorrhage of anus and rectum: Secondary | ICD-10-CM | POA: Diagnosis not present

## 2017-07-19 DIAGNOSIS — K635 Polyp of colon: Secondary | ICD-10-CM | POA: Diagnosis not present

## 2017-07-19 DIAGNOSIS — Z79899 Other long term (current) drug therapy: Secondary | ICD-10-CM | POA: Insufficient documentation

## 2017-07-19 DIAGNOSIS — D128 Benign neoplasm of rectum: Secondary | ICD-10-CM | POA: Insufficient documentation

## 2017-07-19 DIAGNOSIS — Z7982 Long term (current) use of aspirin: Secondary | ICD-10-CM | POA: Insufficient documentation

## 2017-07-19 DIAGNOSIS — Z87891 Personal history of nicotine dependence: Secondary | ICD-10-CM | POA: Diagnosis not present

## 2017-07-19 HISTORY — PX: COLONOSCOPY WITH PROPOFOL: SHX5780

## 2017-07-19 SURGERY — COLONOSCOPY WITH PROPOFOL
Anesthesia: General

## 2017-07-19 MED ORDER — SODIUM CHLORIDE 0.9 % IV SOLN
INTRAVENOUS | Status: DC
  Administered 2017-07-19: 1000 mL via INTRAVENOUS
  Administered 2017-07-19: 09:00:00 via INTRAVENOUS

## 2017-07-19 MED ORDER — LIDOCAINE HCL (PF) 2 % IJ SOLN
INTRAMUSCULAR | Status: AC
  Filled 2017-07-19: qty 10

## 2017-07-19 MED ORDER — PROPOFOL 500 MG/50ML IV EMUL
INTRAVENOUS | Status: AC
  Filled 2017-07-19: qty 50

## 2017-07-19 MED ORDER — PROPOFOL 500 MG/50ML IV EMUL
INTRAVENOUS | Status: DC | PRN
  Administered 2017-07-19: 130 ug/kg/min via INTRAVENOUS

## 2017-07-19 MED ORDER — PROPOFOL 10 MG/ML IV BOLUS
INTRAVENOUS | Status: DC | PRN
  Administered 2017-07-19: 100 mg via INTRAVENOUS

## 2017-07-19 MED ORDER — LIDOCAINE HCL (PF) 1 % IJ SOLN
INTRAMUSCULAR | Status: AC
  Administered 2017-07-19: 0.3 mL via INTRADERMAL
  Filled 2017-07-19: qty 2

## 2017-07-19 MED ORDER — LIDOCAINE HCL (PF) 1 % IJ SOLN
2.0000 mL | Freq: Once | INTRAMUSCULAR | Status: AC
  Administered 2017-07-19: 0.3 mL via INTRADERMAL

## 2017-07-19 MED ORDER — MIDAZOLAM HCL 2 MG/2ML IJ SOLN
INTRAMUSCULAR | Status: AC
  Filled 2017-07-19: qty 2

## 2017-07-19 MED ORDER — LIDOCAINE HCL (CARDIAC) PF 100 MG/5ML IV SOSY
PREFILLED_SYRINGE | INTRAVENOUS | Status: DC | PRN
  Administered 2017-07-19: 50 mg via INTRAVENOUS

## 2017-07-19 MED ORDER — MIDAZOLAM HCL 2 MG/2ML IJ SOLN
INTRAMUSCULAR | Status: DC | PRN
  Administered 2017-07-19: 2 mg via INTRAVENOUS

## 2017-07-19 NOTE — H&P (Signed)
Jesus Bellows, MD [address] [place], [place], [place], [address] [address], [place], [place], [place], [address] Phone: [phone]  Fax: [phone]  Primary Care Physician:  Birdie Sons, MD   Pre-Procedure History & Physical: HPI:  Jesus Mack is a 62 y.o. male is here for an colonoscopy.   Past Medical History:  Diagnosis Date  . Hypertension    controlled with medication;     Past Surgical History:  Procedure Laterality Date  . ACNE CYST REMOVAL Right    Shoulder  . KIDNEY STONE SURGERY     Blasted    Prior to Admission medications   Medication Sig Start Date End Date Taking? Authorizing Provider  amLODipine (NORVASC) 10 MG tablet TAKE 1 TABLET (10 MG TOTAL) BY MOUTH DAILY. 09/05/16   Birdie Sons, MD  aspirin 81 MG EC tablet  07/07/17   [provider]  lovastatin (MEVACOR) 40 MG tablet TAKE 1 TABLET BY MOUTH EVERY DAY 08/05/16   Birdie Sons, MD  Multiple Vitamin (MULTIVITAMIN) tablet Take 1 tablet by mouth daily.    [provider]  triamcinolone cream (KENALOG) 0.1 % Apply sparingly to affected area twice daily as needed 02/14/17   Birdie Sons, MD    Allergies as of 07/11/2017  . (No Known Allergies)    Family History  Problem Relation Age of Onset  . Colon cancer Mother   . Heart disease Mother   . Hypertension Father   . Colon cancer Brother     Social History   Socioeconomic History  . Marital status: Married    Spouse name: Not on file  . Number of children: 2  . Years of education: Not on file  . Highest education level: Not on file  Occupational History  . Occupation: Full-Time  Social Needs  . Financial resource strain: Not on file  . Food insecurity:    Worry: Not on file    Inability: Not on file  . Transportation needs:    Medical: Not on file    Non-medical: Not on file  Tobacco Use  . Smoking status: Former Smoker    Packs/day: 1.00    Years: 45.00    Pack years: 45.00   Types: Cigarettes    Start date: 03/20/1973    Last attempt to quit: 06/26/2017    Years since quitting: 0.0  . Smokeless tobacco: Never Used  . Tobacco comment: recently quit  Substance and Sexual Activity  . Alcohol use: No    Alcohol/week: 0.0 oz  . Drug use: No  . Sexual activity: Not on file  Lifestyle  . Physical activity:    Days per week: Not on file    Minutes per session: Not on file  . Stress: Not on file  Relationships  . Social connections:    Talks on phone: Not on file    Gets together: Not on file    Attends religious service: Not on file    Active member of club or organization: Not on file    Attends meetings of clubs or organizations: Not on file    Relationship status: Not on file  . Intimate partner violence:    Fear of current or ex partner: Not on file    Emotionally abused: Not on file    Physically abused: Not on file    Forced sexual activity: Not on file  Other Topics Concern  . Not on file  Social History  Narrative  . Not on file    Review of Systems: See HPI, otherwise negative ROS  Physical Exam: BP 126/88   Pulse 79   Temp (!) 96.3 F (35.7 C) (Tympanic)   Resp 17   Ht 5\' 6"  (1.676 m)   Wt 200 lb (90.7 kg)   SpO2 96%   BMI 32.28 kg/m  General:   Alert,  pleasant and cooperative in NAD Head:  Normocephalic and atraumatic. Neck:  Supple; no masses or thyromegaly. Lungs:  Clear throughout to auscultation, normal respiratory effort.    Heart:  +S1, +S2, Regular rate and rhythm, No edema. Abdomen:  Soft, nontender and nondistended. Normal bowel sounds, without guarding, and without rebound.   Neurologic:  Alert and  oriented x4;  grossly normal neurologically.  Impression/Plan: Jesus Mack is here for an colonoscopy to be performed for rectal bleeding . Risks, benefits, limitations, and alternatives regarding  colonoscopy have been reviewed with the patient.  Questions have been answered.  All parties agreeable.   Jesus Bellows, MD   07/19/2017, 8:36 AM

## 2017-07-19 NOTE — Anesthesia Post-op Follow-up Note (Signed)
Anesthesia QCDR form completed.        

## 2017-07-19 NOTE — Anesthesia Preprocedure Evaluation (Signed)
Anesthesia Evaluation  Patient identified by MRN, date of birth, ID band Patient awake    Reviewed: Allergy & Precautions, NPO status , Patient's Chart, lab work & pertinent test results  History of Anesthesia Complications Negative for: history of anesthetic complications  Airway Mallampati: III       Dental  (+) Missing, Chipped   Pulmonary neg sleep apnea, neg COPD, former smoker (quit x 3 weeks),           Cardiovascular hypertension, Pt. on medications (-) Past MI and (-) CHF (-) dysrhythmias (-) Valvular Problems/Murmurs     Neuro/Psych neg Seizures    GI/Hepatic Neg liver ROS, GERD (in the past)  ,  Endo/Other  neg diabetes  Renal/GU negative Renal ROS     Musculoskeletal   Abdominal   Peds  Hematology   Anesthesia Other Findings   Reproductive/Obstetrics                             Anesthesia Physical Anesthesia Plan  ASA: II  Anesthesia Plan: General   Post-op Pain Management:    Induction: Intravenous  PONV Risk Score and Plan: 2 and Propofol infusion and TIVA  Airway Management Planned: Nasal Cannula  Additional Equipment:   Intra-op Plan:   Post-operative Plan:   Informed Consent: I have reviewed the patients History and Physical, chart, labs and discussed the procedure including the risks, benefits and alternatives for the proposed anesthesia with the patient or authorized representative who has indicated his/her understanding and acceptance.     Plan Discussed with:   Anesthesia Plan Comments:         Anesthesia Quick Evaluation

## 2017-07-19 NOTE — Op Note (Signed)
[facility] Patient Name: Jesus Mack Procedure Date: 07/19/2017 8:44 AM MRN: [id number] Account #: [id number] Date of Birth: 22-Nov-1955 Admit Type: Outpatient Age: 62 Room: [facility] ENDO ROOM 3 Gender: Male Note Status: Finalized Procedure:            Colonoscopy Indications:          Rectal bleeding Providers:            Jonathon Bellows MD, MD Referring MD:         Kirstie Peri. Caryn Section, MD (Referring MD) Medicines:            Monitored Anesthesia Care Complications:        No immediate complications. Procedure:            Pre-Anesthesia Assessment:                       - Prior to the procedure, a History and Physical was                        performed, and patient medications, allergies and                        sensitivities were reviewed. The patient's tolerance of                        previous anesthesia was reviewed.                       - The risks and benefits of the procedure and the                        sedation options and risks were discussed with the                        patient. All questions were answered and informed                        consent was obtained.                       - ASA Grade Assessment: II - A patient with mild                        systemic disease.                       After obtaining informed consent, the colonoscope was                        passed under direct vision. Throughout the procedure,                        the patient's blood pressure, pulse, and oxygen                        saturations were monitored continuously. The                        Colonoscope was introduced through the anus and                        advanced  to the the cecum, identified by the                        appendiceal orifice, IC valve and transillumination.                        The colonoscopy was performed with ease. The patient                        tolerated the procedure well. The quality of the bowel                preparation was good. Findings:      The perianal and digital rectal examinations were normal.      Non-bleeding internal hemorrhoids were found during retroflexion. The       hemorrhoids were large and Grade I (internal hemorrhoids that do not       prolapse).      Two sessile polyps were found in the rectum and ascending colon. The       polyps were 5 to 7 mm in size. These polyps were removed with a cold       snare. Resection and retrieval were complete.      Three sessile polyps were found in the rectum. The polyps were 3 to 4 mm       in size. These polyps were removed with a cold biopsy forceps. Resection       and retrieval were complete.      The exam was otherwise without abnormality on direct and retroflexion       views. Impression:           - Non-bleeding internal hemorrhoids.                       - Two 5 to 7 mm polyps in the rectum and in the                        ascending colon, removed with a cold snare. Resected                        and retrieved.                       - Three 3 to 4 mm polyps in the rectum, removed with a                        cold biopsy forceps. Resected and retrieved.                       - The examination was otherwise normal on direct and                        retroflexion views. Recommendation:       - Discharge patient to home (with escort).                       - Resume previous diet.                       - Continue present medications.                       -  Await pathology results.                       - Repeat colonoscopy in 3 years for surveillance. Procedure Code(s):    --- Professional ---                       [phone], Colonoscopy, flexible; with removal of tumor(s),                        polyp(s), or other lesion(s) by snare technique                       45380, 63, Colonoscopy, flexible; with biopsy, single                        or multiple Diagnosis Code(s):    --- Professional ---                        D12.2, Benign neoplasm of ascending colon                       K62.1, Rectal polyp                       K64.0, First degree hemorrhoids                       K62.5, Hemorrhage of anus and rectum CPT copyright 2017 American Medical Association. All rights reserved. The codes documented in this report are preliminary and upon coder review may  be revised to meet current compliance requirements. Jonathon Bellows, MD Jonathon Bellows MD, MD 07/19/2017 9:18:43 AM This report has been signed electronically. Number of Addenda: 0 Note Initiated On: 07/19/2017 8:44 AM Scope Withdrawal Time: 0 hours 20 minutes 1 second  Total Procedure Duration: 0 hours 21 minutes 40 seconds       [facility]

## 2017-07-19 NOTE — Transfer of Care (Signed)
Immediate Anesthesia Transfer of Care Note  Patient: Jesus Mack  Procedure(s) Performed: COLONOSCOPY WITH PROPOFOL (N/A )  Patient Location: PACU and Endoscopy Unit  Anesthesia Type:General  Level of Consciousness: drowsy  Airway & Oxygen Therapy: Patient Spontanous Breathing and Patient connected to nasal cannula oxygen  Post-op Assessment: Report given to RN and Post -op Vital signs reviewed and stable  Post vital signs: Reviewed and stable  Last Vitals:  Vitals Value Taken Time  BP 91/64 07/19/2017  9:20 AM  Temp 36.1 C 07/19/2017  9:20 AM  Pulse 86 07/19/2017  9:20 AM  Resp 13 07/19/2017  9:20 AM  SpO2 94 % 07/19/2017  9:20 AM    Last Pain:  Vitals:   07/19/17 0738  TempSrc: Tympanic  PainSc: 0-No pain         Complications: No apparent anesthesia complications

## 2017-07-19 NOTE — Anesthesia Postprocedure Evaluation (Signed)
Anesthesia Post Note  Patient: Jesus Mack  Procedure(s) Performed: COLONOSCOPY WITH PROPOFOL (N/A )  Patient location during evaluation: Endoscopy Anesthesia Type: General Level of consciousness: awake and alert Pain management: pain level controlled Vital Signs Assessment: post-procedure vital signs reviewed and stable Respiratory status: spontaneous breathing and respiratory function stable Cardiovascular status: stable Anesthetic complications: no     Last Vitals:  Vitals:   07/19/17 0738 07/19/17 0920  BP: 126/88 91/64  Pulse: 79 86  Resp: 17 13  Temp: (!) 35.7 C (!) 36.1 C  SpO2: 96% 94%    Last Pain:  Vitals:   07/19/17 0738  TempSrc: Tympanic  PainSc: 0-No pain                 KEPHART,WILLIAM K

## 2017-07-20 ENCOUNTER — Encounter: Payer: Self-pay | Admitting: Gastroenterology

## 2017-07-20 LAB — SURGICAL PATHOLOGY

## 2017-07-22 ENCOUNTER — Encounter: Payer: Self-pay | Admitting: Gastroenterology

## 2017-08-06 ENCOUNTER — Other Ambulatory Visit: Payer: Self-pay | Admitting: Family Medicine

## 2017-08-20 NOTE — Progress Notes (Signed)
Patient: Jesus Mack Male    DOB: 1956/02/25   62 y.o.   MRN: [id number] Visit Date: 08/21/2017  Today's Provider: Lelon Huh, MD   Chief Complaint  Patient presents with  . Foot Problem    x 8 months   Subjective:    HPI Foot problem: Patient comes in complaining of a hard growth on the bottom of his left foot. He reports occasional pain. He states the growth appeared about 8 months ago. He had been seeing Dr. Prudence Davidson, but states insurance no longer covers his services. He would like to change to a podiatrist covered by his insurance.  He also reports recurrence numbness and tingling in his right leg. It is worse when he is sitting down. Leg feels heavy. He has had episodes of radicular back pain in the past treated with prednisone. He states previous course of prednisone have helped back pain, but leg remains numb and heavy  He is also here to follow up on hypertension and hyperlipidemia. Is tolerating lovastatin and amlodipine well without adverse effects. No chest pains, muscle aches or palpitations.   He also reports he has trouble with "intestines falling out" when he has BM. He did have colonoscopy recently with several large hemorrhoids. He states GI told him they could take care of it and wants to know if he needs a referral to go back.     No Known Allergies   Current Outpatient Medications:  .  amLODipine (NORVASC) 10 MG tablet, TAKE 1 TABLET (10 MG TOTAL) BY MOUTH DAILY., Disp: 30 tablet, Rfl: 12 .  aspirin 81 MG EC tablet, , Disp: , Rfl:  .  lovastatin (MEVACOR) 40 MG tablet, TAKE 1 TABLET BY MOUTH EVERY DAY, Disp: 30 tablet, Rfl: 11 .  Multiple Vitamin (MULTIVITAMIN) tablet, Take 1 tablet by mouth daily., Disp: , Rfl:  .  triamcinolone cream (KENALOG) 0.1 %, Apply sparingly to affected area twice daily as needed, Disp: 30 g, Rfl: 1  Review of Systems  Constitutional: Negative for appetite change, chills and fever.  Respiratory: Negative for chest  tightness, shortness of breath and wheezing.   Cardiovascular: Negative for chest pain and palpitations.  Gastrointestinal: Negative for abdominal pain, nausea and vomiting.  Skin:       Growth on the bottom of his left foot    Social History   Tobacco Use  . Smoking status: Former Smoker    Packs/day: 1.00    Years: 45.00    Pack years: 45.00    Types: Cigarettes    Start date: 03/20/1973    Last attempt to quit: 06/26/2017    Years since quitting: 0.1  . Smokeless tobacco: Never Used  . Tobacco comment: recently quit  Substance Use Topics  . Alcohol use: No    Alcohol/week: 0.0 oz   Objective:   BP 138/76 (BP Location: Left Arm, Patient Position: Sitting, Cuff Size: Large)   Pulse (!) 59   Temp 97.8 F (36.6 C) (Oral)   Resp 16   Wt 203 lb (92.1 kg)   SpO2 95% Comment: room air  BMI 32.77 kg/m  Vitals:   08/21/17 0803  BP: 138/76  Pulse: (!) 59  Resp: 16  Temp: 97.8 F (36.6 C)  TempSrc: Oral  SpO2: 95%  Weight: 203 lb (92.1 kg)     Physical Exam   General Appearance:    Alert, cooperative, no distress  Eyes:    PERRL, conjunctiva/corneas clear, EOM's intact  Lungs:     Clear to auscultation bilaterally, respirations unlabored  Heart:    Regular rate and rhythm  Ext:   Calluses on plantar aspects of left foot.   Neurologic:   Awake, alert, oriented x 3.          Assessment & Plan:     1. Essential hypertension Well controlled.  Continue current medications.   - Comprehensive metabolic panel  2. Hyperlipidemia, unspecified hyperlipidemia type He is tolerating lovastatin well with no adverse effects.   - Lipid panel  3. Prostate cancer screening  - PSA  4. Vitamin D deficiency  - VITAMIN D 25 Hydroxy (Vit-D Deficiency, Fractures)  5. Hemorrhoids, unspecified hemorrhoid type He is going to Call Dr. Georgeann Oppenheim office to make follow up appointment since he is already established. Advised he can call for referral if required. Advised may be  candidate for general surgery. He will check will GI first.   6. Numbness and tingling of right leg Likely secondary to lumbar spondylosis, although he is not currently having back pain. Did not respond to previous courses of prednisone. Will have neuro evaluate.  - Ambulatory referral to Neurology  7. Callus of foot Need to establish with podiatrist in network for his insurance.  - Ambulatory referral to Early, MD  [facility]

## 2017-08-21 ENCOUNTER — Ambulatory Visit: Payer: 59 | Admitting: Family Medicine

## 2017-08-21 ENCOUNTER — Encounter: Payer: Self-pay | Admitting: Family Medicine

## 2017-08-21 VITALS — BP 138/76 | HR 59 | Temp 97.8°F | Resp 16 | Wt 203.0 lb

## 2017-08-21 DIAGNOSIS — E785 Hyperlipidemia, unspecified: Secondary | ICD-10-CM | POA: Diagnosis not present

## 2017-08-21 DIAGNOSIS — I10 Essential (primary) hypertension: Secondary | ICD-10-CM

## 2017-08-21 DIAGNOSIS — E559 Vitamin D deficiency, unspecified: Secondary | ICD-10-CM

## 2017-08-21 DIAGNOSIS — Z125 Encounter for screening for malignant neoplasm of prostate: Secondary | ICD-10-CM | POA: Diagnosis not present

## 2017-08-21 DIAGNOSIS — R200 Anesthesia of skin: Secondary | ICD-10-CM | POA: Diagnosis not present

## 2017-08-21 DIAGNOSIS — R202 Paresthesia of skin: Secondary | ICD-10-CM | POA: Diagnosis not present

## 2017-08-21 DIAGNOSIS — L84 Corns and callosities: Secondary | ICD-10-CM

## 2017-08-21 DIAGNOSIS — K649 Unspecified hemorrhoids: Secondary | ICD-10-CM | POA: Insufficient documentation

## 2017-08-22 ENCOUNTER — Telehealth: Payer: Self-pay | Admitting: Gastroenterology

## 2017-08-22 LAB — COMPREHENSIVE METABOLIC PANEL
ALK PHOS: 68 IU/L (ref 39–117)
ALT: 15 IU/L (ref 0–44)
AST: 18 IU/L (ref 0–40)
Albumin/Globulin Ratio: 1.5 (ref 1.2–2.2)
Albumin: 4.6 g/dL (ref 3.6–4.8)
BUN/Creatinine Ratio: 13 (ref 10–24)
BUN: 16 mg/dL (ref 8–27)
Bilirubin Total: 0.3 mg/dL (ref 0.0–1.2)
CO2: 25 mmol/L (ref 20–29)
CREATININE: 1.19 mg/dL (ref 0.76–1.27)
Calcium: 9.9 mg/dL (ref 8.6–10.2)
Chloride: 102 mmol/L (ref 96–106)
GFR calc Af Amer: 76 mL/min/{1.73_m2} (ref >59)
GFR calc non Af Amer: 66 mL/min/{1.73_m2} (ref >59)
Globulin, Total: 3 g/dL (ref 1.5–4.5)
Glucose: 89 mg/dL (ref 65–99)
Potassium: 5.2 mmol/L (ref 3.5–5.2)
SODIUM: 141 mmol/L (ref 134–144)
Total Protein: 7.6 g/dL (ref 6.0–8.5)

## 2017-08-22 LAB — LIPID PANEL
CHOLESTEROL TOTAL: 174 mg/dL (ref 100–199)
Chol/HDL Ratio: 2.7 ratio (ref 0.0–5.0)
HDL: 64 mg/dL (ref >39)
LDL CALC: 97 mg/dL (ref 0–99)
TRIGLYCERIDES: 65 mg/dL (ref 0–149)
VLDL CHOLESTEROL CAL: 13 mg/dL (ref 5–40)

## 2017-08-22 LAB — VITAMIN D 25 HYDROXY (VIT D DEFICIENCY, FRACTURES): Vit D, 25-Hydroxy: 17.7 ng/mL — ABNORMAL LOW (ref 30.0–100.0)

## 2017-08-22 LAB — PSA: Prostate Specific Ag, Serum: 0.7 ng/mL (ref 0.0–4.0)

## 2017-08-22 NOTE — Telephone Encounter (Signed)
Patient is scheduled with Dr. Marius Ditch for hemmorhoids 7/19 but needs something called in until then. He is a patient of Dr. Georgeann Oppenheim [facility] in [place] Please call patient to let him know if and when you call in something.

## 2017-08-22 NOTE — Telephone Encounter (Signed)
Yeah, tell Romero Liner to schedule him for this month  Thanks  Rohini

## 2017-08-31 ENCOUNTER — Ambulatory Visit: Payer: 59 | Admitting: Gastroenterology

## 2017-08-31 ENCOUNTER — Encounter: Payer: Self-pay | Admitting: Gastroenterology

## 2017-08-31 VITALS — BP 131/80 | HR 66 | Resp 16 | Ht 66.0 in | Wt 203.6 lb

## 2017-08-31 DIAGNOSIS — K642 Third degree hemorrhoids: Secondary | ICD-10-CM | POA: Diagnosis not present

## 2017-08-31 NOTE — Progress Notes (Signed)
Cephas Darby, MD [address]  [place]  [place], [place] [address]  Main: [phone]  Fax: [phone]    Gastroenterology Consultation  Referring Provider:     Birdie Sons, MD Primary Care Physician:  Birdie Sons, MD Primary Gastroenterologist:  Dr. Cephas Darby Reason for Consultation:     Symptomatic hemorrhoids        HPI:   Jesus Mack is a 62 y.o. male referred by Dr. Birdie Sons, MD  for consultation & management of symptomatic hemorrhoids. He is seen by Dr. 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 Patient reports that ever since he to the bowel prep for a colonoscopy, he has prolapsed hemorrhoid associated with itching. It has become an annoyance for him every time he has a bowel movement and while taking shower. He denies rectal bleeding anymore.  He is referred here to discuss about hemorrhoidal ligation   NSAIDs: none  Antiplts/Anticoagulants/Anti thrombotics: none  GI Procedures:  Colonoscopy 07/19/17 Serrated polyp in the rectum Family history of colon cancer  Past Medical History:  Diagnosis Date  . Hypertension    controlled with medication;     Past Surgical History:  Procedure Laterality Date  . ACNE CYST REMOVAL Right    Shoulder  . COLONOSCOPY WITH PROPOFOL N/A 07/19/2017   Procedure: COLONOSCOPY WITH PROPOFOL;  Surgeon: Jonathon Bellows, MD;  Location: [facility] ENDOSCOPY;  Service: Gastroenterology;  Laterality: N/A;  . KIDNEY STONE SURGERY     Blasted    Current Outpatient Medications:  .  amLODipine (NORVASC) 10 MG tablet, TAKE 1 TABLET (10 MG TOTAL) BY MOUTH DAILY., Disp: 30 tablet, Rfl: 12 .  aspirin 81 MG EC tablet, , Disp: , Rfl:  .  lovastatin (MEVACOR) 40 MG tablet, TAKE 1 TABLET BY MOUTH EVERY DAY, Disp: 30 tablet, Rfl: 11 .  Multiple Vitamin (MULTIVITAMIN) tablet, Take 1 tablet by mouth daily., Disp: , Rfl:  .  triamcinolone cream (KENALOG) 0.1 %, Apply sparingly  to affected area twice daily as needed, Disp: 30 g, Rfl: 1   Family History  Problem Relation Age of Onset  . Colon cancer Mother   . Heart disease Mother   . Hypertension Father   . Colon cancer Brother      Social History   Tobacco Use  . Smoking status: Former Smoker    Packs/day: 1.00    Years: 45.00    Pack years: 45.00    Types: Cigarettes    Start date: 03/20/1973    Last attempt to quit: 06/26/2017    Years since quitting: 0.1  . Smokeless tobacco: Never Used  . Tobacco comment: recently quit  Substance Use Topics  . Alcohol use: No    Alcohol/week: 0.0 oz  . Drug use: No    Allergies as of 08/31/2017  . (No Known Allergies)    Review of Systems:    All systems reviewed and negative except where noted in HPI.   Physical Exam:  BP 131/80 (BP Location: Left Arm, Patient Position: Sitting, Cuff Size: Large)   Pulse 66   Resp 16   Ht 5\' 6"  (1.676 m)   Wt 203 lb 9.6 oz (92.4 kg)   BMI 32.86 kg/m  No LMP for male patient.  General:   Alert,  Well-developed, well-nourished, pleasant and cooperative in NAD Head:  Normocephalic and atraumatic. Eyes:  Sclera clear, no icterus.   Conjunctiva pink. Ears:  Normal  auditory acuity. Nose:  No deformity, discharge, or lesions. Mouth:  No deformity or lesions,oropharynx pink & moist. Neck:  Supple; no masses or thyromegaly. Lungs:  Respirations even and unlabored.  Clear throughout to auscultation.   No wheezes, crackles, or rhonchi. No acute distress. Heart:  Regular rate and rhythm; no murmurs, clicks, rubs, or gallops. Abdomen:  Normal bowel sounds. Soft, non-tender and non-distended without masses, hepatosplenomegaly or hernias noted.  No guarding or rebound tenderness.   Rectal: prolapsed right anterior external hemorrhoid, nontender, nonthrombosed, able to be reduced manually Msk:  Symmetrical without gross deformities. Good, equal movement & strength bilaterally. Pulses:  Normal pulses noted. Extremities:  No  clubbing or edema.  No cyanosis. Neurologic:  Alert and oriented x3;  grossly normal neurologically. Skin:  Intact without significant lesions or rashes. No jaundice. Psych:  Alert and cooperative. Normal mood and affect.  Imaging Studies: No abdominal imaging  Assessment and Plan:   Jesus Mack is a 62 y.o. African-American male with tobacco use, seen in consultation for management of symptomatic external hemorrhoid. He has grade 3 right anterior external hemorrhoid associated with symptoms. I discussed with him in detail about the [facility] hemorrhoidal banding procedure, risks and benefits and he is willing to undergo. Consent obtained. Patient appeared to be anxious about the procedure. Answered all his questions  Perform hemorrhoid ligation today  Follow up in 2 weeks   Cephas Darby, MD

## 2017-08-31 NOTE — Progress Notes (Signed)
PROCEDURE NOTE: The patient presents with symptomatic grade 3 hemorrhoids, unresponsive to maximal medical therapy, requesting rubber band ligation of his/her hemorrhoidal disease.  All risks, benefits and alternative forms of therapy were described and informed consent was obtained.  The decision was made to band the RA internal hemorrhoid, and the [place] was used to perform band ligation without complication.  Digital anorectal examination was then performed to assure proper positioning of the band, and to adjust the banded tissue as required.  The patient was discharged home without pain or other issues.  Dietary and behavioral recommendations were given and (if necessary - prescriptions were given), along with follow-up instructions.  The patient will return 2 weeks for follow-up and possible additional banding as required.  No complications were encountered and the patient tolerated the procedure well.  Cephas Darby, MD [address]  [place]  [place], [place] [address]  Main: [phone]  Fax: [phone] Pager: [phone]

## 2017-09-08 ENCOUNTER — Other Ambulatory Visit: Payer: Self-pay | Admitting: Family Medicine

## 2017-09-14 ENCOUNTER — Encounter: Payer: Self-pay | Admitting: Gastroenterology

## 2017-09-14 ENCOUNTER — Ambulatory Visit (INDEPENDENT_AMBULATORY_CARE_PROVIDER_SITE_OTHER): Payer: 59 | Admitting: Gastroenterology

## 2017-09-14 VITALS — BP 144/80 | HR 93 | Resp 18 | Ht 66.0 in | Wt 205.8 lb

## 2017-09-14 DIAGNOSIS — K642 Third degree hemorrhoids: Secondary | ICD-10-CM | POA: Diagnosis not present

## 2017-09-14 NOTE — Progress Notes (Signed)
PROCEDURE NOTE: The patient presents with symptomatic grade 3 hemorrhoids, unresponsive to maximal medical therapy, requesting rubber band ligation of his/her hemorrhoidal disease.  All risks, benefits and alternative forms of therapy were described and informed consent was obtained.   The decision was made to band the LL internal hemorrhoid, and the [facility] System was used to perform band ligation without complication.  Digital anorectal examination was then performed to assure proper positioning of the band, and to adjust the banded tissue as required.  The patient was discharged home without pain or other issues.  Dietary and behavioral recommendations were given and (if necessary - prescriptions were given), along with follow-up instructions.  The patient will return 2 weeks for follow-up and possible additional banding as required.  No complications were encountered and the patient tolerated the procedure well.  Jonda Alanis R Ervan Heber, MD [address]  [address]  [place], [place] [address]  Main: [phone]  Fax: [phone] Pager: [phone]     

## 2017-10-01 ENCOUNTER — Encounter: Payer: Self-pay | Admitting: Gastroenterology

## 2017-10-01 ENCOUNTER — Ambulatory Visit: Payer: 59 | Admitting: Gastroenterology

## 2017-10-01 VITALS — BP 138/81 | HR 75 | Ht 66.0 in | Wt 206.2 lb

## 2017-10-01 DIAGNOSIS — K642 Third degree hemorrhoids: Secondary | ICD-10-CM | POA: Diagnosis not present

## 2017-10-01 NOTE — Progress Notes (Signed)
PROCEDURE NOTE: The patient presents with symptomatic grade 3 hemorrhoids, unresponsive to maximal medical therapy, requesting rubber band ligation of his/her hemorrhoidal disease.  All risks, benefits and alternative forms of therapy were described and informed consent was obtained.   The decision was made to band the RP internal hemorrhoid, and the [place] was used to perform band ligation without complication.  Digital anorectal examination was then performed to assure proper positioning of the band, and to adjust the banded tissue as required.  The patient was discharged home without pain or other issues.  Dietary and behavioral recommendations were given and (if necessary - prescriptions were given), along with follow-up instructions.  The patient will return  as needed for follow-up and possible additional banding as required.  No complications were encountered and the patient tolerated the procedure well.  Cephas Darby, MD [address]  [place]  [place], [place] [address]  Main: [phone]  Fax: [phone] Pager: [phone]

## 2017-10-05 ENCOUNTER — Ambulatory Visit: Payer: 59 | Admitting: Gastroenterology

## 2017-10-17 ENCOUNTER — Other Ambulatory Visit: Payer: Self-pay | Admitting: Neurology

## 2017-10-17 DIAGNOSIS — R200 Anesthesia of skin: Secondary | ICD-10-CM

## 2017-10-29 ENCOUNTER — Ambulatory Visit
Admission: RE | Admit: 2017-10-29 | Discharge: 2017-10-29 | Disposition: A | Discharge status: Home / Self Care | Payer: Commercial Managed Care - HMO | Source: Ambulatory Visit | Attending: Neurology | Admitting: Neurology

## 2017-10-29 DIAGNOSIS — M48061 Spinal stenosis, lumbar region without neurogenic claudication: Secondary | ICD-10-CM | POA: Diagnosis not present

## 2017-10-29 DIAGNOSIS — G8929 Other chronic pain: Secondary | ICD-10-CM | POA: Diagnosis present

## 2017-10-29 DIAGNOSIS — R200 Anesthesia of skin: Secondary | ICD-10-CM

## 2017-10-29 DIAGNOSIS — M545 Low back pain: Secondary | ICD-10-CM | POA: Diagnosis present

## 2018-05-07 ENCOUNTER — Other Ambulatory Visit: Payer: Self-pay | Admitting: Family Medicine

## 2018-05-17 ENCOUNTER — Ambulatory Visit: Payer: Self-pay | Admitting: Family Medicine

## 2018-05-17 ENCOUNTER — Encounter: Payer: Self-pay | Admitting: Family Medicine

## 2018-05-17 VITALS — BP 144/82 | HR 82 | Temp 97.9°F | Resp 16 | Wt 227.0 lb

## 2018-05-17 DIAGNOSIS — R0602 Shortness of breath: Secondary | ICD-10-CM

## 2018-05-17 DIAGNOSIS — L309 Dermatitis, unspecified: Secondary | ICD-10-CM

## 2018-05-17 DIAGNOSIS — Z87891 Personal history of nicotine dependence: Secondary | ICD-10-CM

## 2018-05-17 MED ORDER — KETOCONAZOLE 2 % EX CREA: 1.0000 "application " | TOPICAL_CREAM | Freq: Two times a day (BID) | CUTANEOUS | 2 refills | Status: AC

## 2018-05-17 MED ORDER — TIOTROPIUM BROMIDE MONOHYDRATE 2.5 MCG/ACT IN AERS: 2.0000 | INHALATION_SPRAY | Freq: Every day | RESPIRATORY_TRACT | 0 refills | Status: DC

## 2018-05-17 NOTE — Progress Notes (Signed)
Patient: Jesus Mack Male    DOB: 09-08-1955   63 y.o.   MRN: [id number] Visit Date: 05/17/2018  Today's Provider: Lelon Huh, MD   Chief Complaint  Patient presents with  . Ankle Pain   Subjective:     HPI  Patient presents today c/o sore on his left ankle. He reports that he has had symptoms for about 2 months. He reports that he cleans it with peroxide and keeps it covered, but it does not look like it is healing. Has also tried Gold bond which helps with itching but does not heal lesion  Also complains of shortness of breath with exertion    Wt Readings from Last 3 Encounters:  05/17/18 227 lb (103 kg)  10/01/17 206 lb 3.2 oz (93.5 kg)  09/14/17 205 lb 12.8 oz (93.4 kg)    No Known Allergies   Current Outpatient Medications:  .  amLODipine (NORVASC) 10 MG tablet, TAKE 1 TABLET (10 MG TOTAL) BY MOUTH DAILY., Disp: 30 tablet, Rfl: 12 .  [facility] ASPIRIN LOW STRENGTH 81 MG EC tablet, TAKE 1 TABLET (81 MG TOTAL) BY MOUTH DAILY., Disp: 90 tablet, Rfl: 4 .  lovastatin (MEVACOR) 40 MG tablet, TAKE 1 TABLET BY MOUTH EVERY DAY, Disp: 30 tablet, Rfl: 11 .  Multiple Vitamin (MULTIVITAMIN) tablet, Take 1 tablet by mouth daily., Disp: , Rfl:  .  triamcinolone cream (KENALOG) 0.1 %, Apply sparingly to affected area twice daily as needed, Disp: 30 g, Rfl: 1  Review of Systems  Constitutional: Negative for activity change, appetite change and fatigue.  Cardiovascular: Negative for leg swelling.  Musculoskeletal: Negative for arthralgias and myalgias.  Skin: Positive for color change and wound. Negative for pallor and rash.  Neurological: Negative for weakness and numbness.    Social History   Tobacco Use  . Smoking status: Former Smoker    Packs/day: 1.00    Years: 45.00    Pack years: 45.00    Types: Cigarettes    Start date: 03/20/1973    Last attempt to quit: 06/26/2017    Years since quitting: 0.8  . Smokeless tobacco: Never Used  . Tobacco comment: recently  quit  Substance Use Topics  . Alcohol use: No    Alcohol/week: 0.0 standard drinks      Objective:   BP (!) 144/82 (BP Location: Right Arm, Patient Position: Sitting, Cuff Size: Large)   Pulse 82   Temp 97.9 F (36.6 C)   Resp 16   Wt 227 lb (103 kg)   SpO2 100%   BMI 36.64 kg/m  Vitals:   05/17/18 1034  BP: (!) 144/82  Pulse: 82  Resp: 16  Temp: 97.9 F (36.6 C)  SpO2: 100%  Weight: 227 lb (103 kg)     Physical Exam  General appearance: alert, well developed, well nourished, cooperative and in no distress Head: Normocephalic, without obvious abnormality, atraumatic Respiratory: Distant breath sounds Extremities: No gross deformities Skin: scaly round patch left medial ankle, see photo  Spirometry: Patient unable to complete, passed out during trial. Result not interpretable. :     Assessment & Plan    1. Shortness of breath Likely COPD. He was unable to complete spirometry. Try samples of  - Tiotropium Bromide Monohydrate (SPIRIVA RESPIMAT) 2.5 MCG/ACT AERS; Inhale 2 puffs into the lungs daily.  Dispense: 1 Inhaler; Refill: 0  2. Former smoker Likely COPD  3. Dermatitis Likely fungal. Did not clear with OTC steroids. try- ketoconazole (  NIZORAL) 2 % cream; Apply 1 application topically 2 (two) times daily.  Dispense: 30 g; Refill: 2  Call for dermatology referral if not improving in 2-3 233ks.      Lelon Huh, MD  [facility]

## 2018-05-25 NOTE — Patient Instructions (Signed)
.   Please review the attached list of medications and notify my office if there are any errors.   . Please bring all of your medications to every appointment so we can make sure that our medication list is the same as yours.   

## 2018-06-03 ENCOUNTER — Telehealth: Payer: Self-pay | Admitting: Family Medicine

## 2018-06-03 NOTE — Telephone Encounter (Signed)
Patient reports that he has had improvement on Spiriva he states that [facility] uses [facility] [place] ( patient has scheduled appt tomorrow at [facility]). KW

## 2018-06-03 NOTE — Telephone Encounter (Signed)
Please check with patient to see if he has noticed any improvement in breathing since starting Spiriva samples. If so, then we can get more samples or a prescription

## 2018-06-04 ENCOUNTER — Other Ambulatory Visit: Payer: Self-pay

## 2018-06-04 ENCOUNTER — Encounter: Payer: Self-pay | Admitting: Family Medicine

## 2018-06-04 ENCOUNTER — Ambulatory Visit: Payer: Self-pay | Admitting: Family Medicine

## 2018-06-04 VITALS — BP 138/78 | HR 80 | Temp 98.3°F | Resp 16 | Wt 227.0 lb

## 2018-06-04 DIAGNOSIS — R0602 Shortness of breath: Secondary | ICD-10-CM

## 2018-06-04 DIAGNOSIS — J449 Chronic obstructive pulmonary disease, unspecified: Secondary | ICD-10-CM

## 2018-06-04 DIAGNOSIS — K642 Third degree hemorrhoids: Secondary | ICD-10-CM

## 2018-06-04 DIAGNOSIS — L309 Dermatitis, unspecified: Secondary | ICD-10-CM

## 2018-06-04 MED ORDER — TIOTROPIUM BROMIDE MONOHYDRATE 2.5 MCG/ACT IN AERS: 2.0000 | INHALATION_SPRAY | Freq: Every day | RESPIRATORY_TRACT | 4 refills | Status: AC

## 2018-06-04 NOTE — Patient Instructions (Signed)
.   Please review the attached list of medications and notify my office if there are any errors.   . Please bring all of your medications to every appointment so we can make sure that our medication list is the same as yours.    You can stop aspirin until bleed stops, then resume, but only take every day.   Let me know if bleeding doesn't stop by end of March

## 2018-06-04 NOTE — Progress Notes (Signed)
Patient: Jesus Mack Male    DOB: 08/30/1955   63 y.o.   MRN: [id number] Visit Date: 06/04/2018  Today's Provider: Lelon Huh, MD   Chief Complaint  Patient presents with  . Shortness of Breath    Follow up visit  . Rectal Bleeding   Subjective:     Shortness of Breath  This is a chronic problem. The current episode started more than 1 year ago. The problem has been gradually improving. Associated symptoms include abdominal pain (Occasional lower right sided pain.), leg swelling (Left leg) and a rash. Pertinent negatives include no chest pain, headaches, vomiting or wheezing.  Rectal Bleeding   Episode onset: Has been going to for about a year.  Comes and goes.  The problem occurs frequently. The problem has been gradually worsening (Pt states it is becoming more frequent.  Daily now.). Associated symptoms include abdominal pain (Occasional lower right sided pain.), coughing and rash. Pertinent negatives include no diarrhea, no nausea, no rectal pain, no vomiting, no chest pain and no headaches.   States that breathing is greatly improved since starting Spiriva, can walk twice a long before feeling short of breath, is tolerating well with no side effects.   He also reports seeing light blood mixed in water in toilet after having BM. He had similar issues last year and had colonoscopy finding multiple hemorrhoids. He was referred to Dr. Marius Ditch and had hemorrhoid banding. Has had bleeding off and on since then, but has been persistent that last two weeks with every BM, but no bleeding between BMs.   He also has rash on inside of left ankle and was prescribed ketoconazole at last visit three weeks ago and reports rash has improved, but is itchy.   No Known Allergies   Current Outpatient Medications:  .  amLODipine (NORVASC) 10 MG tablet, TAKE 1 TABLET (10 MG TOTAL) BY MOUTH DAILY., Disp: 30 tablet, Rfl: 12 .  [facility] ASPIRIN LOW STRENGTH 81 MG EC tablet, TAKE 1 TABLET (81 MG  TOTAL) BY MOUTH DAILY., Disp: 90 tablet, Rfl: 4 .  ketoconazole (NIZORAL) 2 % cream, Apply 1 application topically 2 (two) times daily., Disp: 30 g, Rfl: 2 .  lovastatin (MEVACOR) 40 MG tablet, TAKE 1 TABLET BY MOUTH EVERY DAY, Disp: 30 tablet, Rfl: 11 .  Multiple Vitamin (MULTIVITAMIN) tablet, Take 1 tablet by mouth daily., Disp: , Rfl:  .  Tiotropium Bromide Monohydrate (SPIRIVA RESPIMAT) 2.5 MCG/ACT AERS, Inhale 2 puffs into the lungs daily., Disp: 1 Inhaler, Rfl: 0 .  triamcinolone cream (KENALOG) 0.1 %, Apply sparingly to affected area twice daily as needed, Disp: 30 g, Rfl: 1  Review of Systems  Constitutional: Negative.   Respiratory: Positive for cough and shortness of breath. Negative for apnea, choking, chest tightness, wheezing and stridor.   Cardiovascular: Positive for leg swelling (Left leg). Negative for chest pain and palpitations.  Gastrointestinal: Positive for abdominal pain (Occasional lower right sided pain.), anal bleeding, blood in stool and hematochezia. Negative for abdominal distention, constipation, diarrhea, nausea, rectal pain and vomiting.  Skin: Positive for rash.  Neurological: Negative for dizziness, light-headedness and headaches.    Social History   Tobacco Use  . Smoking status: Former Smoker    Packs/day: 1.00    Years: 45.00    Pack years: 45.00    Types: Cigarettes    Start date: 03/20/1973    Last attempt to quit: 06/26/2017    Years since quitting: 0.9  .  Smokeless tobacco: Never Used  . Tobacco comment: recently quit  Substance Use Topics  . Alcohol use: No    Alcohol/week: 0.0 standard drinks      Objective:   BP 138/78 (BP Location: Right Arm, Patient Position: Sitting, Cuff Size: Large)   Pulse 80   Temp 98.3 F (36.8 C) (Oral)   Resp 16   Wt 227 lb (103 kg)   BMI 36.64 kg/m  Vitals:   06/04/18 0814  BP: 138/78  Pulse: 80  Resp: 16  Temp: 98.3 F (36.8 C)  TempSrc: Oral  Weight: 227 lb (103 kg)     Physical Exam    General Appearance:    Alert, cooperative, no distress  Eyes:    PERRL, conjunctiva/corneas clear, EOM's intact       Lungs:     Clear to auscultation bilaterally, respirations unlabored  Heart:    Regular rate and rhythm  Neurologic:   Awake, alert, oriented x 3. No apparent focal neurological           defect.   Ext:  Scaly eruption left inner distal leg improved from last visit. + edema right lower leg. 2+ on left.       Assessment & Plan    1. Shortness of breath Greatly improved on Spiriva, but has no insurance. Given 2 sample of respimat today and will have pharmacist evaluate.  - Referral to Chronic Care Management Services - Tiotropium Bromide Monohydrate (SPIRIVA RESPIMAT) 2.5 MCG/ACT AERS; Inhale 2 puffs into the lungs daily.  Dispense: 3 Inhaler; Refill: 4  2. Chronic obstructive pulmonary disease, unspecified COPD type ([place])  - Referral to Chronic Care Management Services  3. Grade III hemorrhoids Can d/c aspirin for a few weeks as per PI  4. Dermatitis Improving, continue ketoconazole until resolve and may use his prescription steroid cream prn for itching.   Follow up 6 months for htn.     Lelon Huh, MD  [facility]

## 2018-06-07 ENCOUNTER — Ambulatory Visit (INDEPENDENT_AMBULATORY_CARE_PROVIDER_SITE_OTHER): Payer: Self-pay | Admitting: Family Medicine

## 2018-06-07 ENCOUNTER — Other Ambulatory Visit: Payer: Self-pay

## 2018-06-07 ENCOUNTER — Telehealth: Payer: Self-pay | Admitting: Family Medicine

## 2018-06-07 ENCOUNTER — Ambulatory Visit: Payer: Self-pay

## 2018-06-07 ENCOUNTER — Encounter: Payer: Self-pay | Admitting: Family Medicine

## 2018-06-07 VITALS — BP 134/82 | HR 100 | Temp 98.3°F | Resp 16 | Wt 227.0 lb

## 2018-06-07 DIAGNOSIS — L03116 Cellulitis of left lower limb: Secondary | ICD-10-CM

## 2018-06-07 DIAGNOSIS — F17210 Nicotine dependence, cigarettes, uncomplicated: Secondary | ICD-10-CM

## 2018-06-07 DIAGNOSIS — E785 Hyperlipidemia, unspecified: Secondary | ICD-10-CM

## 2018-06-07 DIAGNOSIS — I10 Essential (primary) hypertension: Secondary | ICD-10-CM

## 2018-06-07 MED ORDER — CEPHALEXIN 500 MG PO CAPS: 500.0000 mg | ORAL_CAPSULE | Freq: Four times a day (QID) | ORAL | 0 refills | Status: DC

## 2018-06-07 NOTE — Progress Notes (Signed)
       Patient: Jesus Mack Male    DOB: [birth year]   63 y.o.   MRN: [id number] Visit Date: 06/07/2018  Today's Provider: Lelon Huh, MD   Chief Complaint  Patient presents with  . Foot Swelling   Subjective:     HPI Patient comes presents today c/o swollen ankle and foot on left side. He reports that he first noticed symptoms 3 days ago. He denies any injuries. He reports that he was treated for dermatitis on the same ankle and foot (left)  about 2 weeks ago with ketoconazole rash which has since resolved.    No Known Allergies   Current Outpatient Medications:  .  amLODipine (NORVASC) 10 MG tablet, TAKE 1 TABLET (10 MG TOTAL) BY MOUTH DAILY., Disp: 30 tablet, Rfl: 12 .  [facility] ASPIRIN LOW STRENGTH 81 MG EC tablet, TAKE 1 TABLET (81 MG TOTAL) BY MOUTH DAILY., Disp: 90 tablet, Rfl: 4 .  ketoconazole (NIZORAL) 2 % cream, Apply 1 application topically 2 (two) times daily., Disp: 30 g, Rfl: 2 .  lovastatin (MEVACOR) 40 MG tablet, TAKE 1 TABLET BY MOUTH EVERY DAY, Disp: 30 tablet, Rfl: 11 .  Multiple Vitamin (MULTIVITAMIN) tablet, Take 1 tablet by mouth daily., Disp: , Rfl:  .  Tiotropium Bromide Monohydrate (SPIRIVA RESPIMAT) 2.5 MCG/ACT AERS, Inhale 2 puffs into the lungs daily., Disp: 3 Inhaler, Rfl: 4 .  triamcinolone cream (KENALOG) 0.1 %, Apply sparingly to affected area twice daily as needed, Disp: 30 g, Rfl: 1  Review of Systems  Constitutional: Negative for activity change, appetite change, chills, diaphoresis, fatigue, fever and unexpected weight change.  Musculoskeletal: Negative.   Skin: Positive for color change and rash.    Social History   Tobacco Use  . Smoking status: Former Smoker    Packs/day: 1.00    Years: 45.00    Pack years: 45.00    Types: Cigarettes    Start date: 03/20/1973    Last attempt to quit: 06/26/2017    Years since quitting: 0.9  . Smokeless tobacco: Never Used  . Tobacco comment: started smoking as teenager ut 1 ppd until 63yo   Substance Use Topics  . Alcohol use: No    Alcohol/week: 0.0 standard drinks      Objective:   BP 134/82 (BP Location: Left Arm, Patient Position: Sitting, Cuff Size: Normal)   Pulse 100   Temp 98.3 F (36.8 C)   Resp 16   Wt 227 lb (103 kg)   SpO2 95%   BMI 36.64 kg/m  Vitals:   06/07/18 1353  BP: 134/82  Pulse: 100  Resp: 16  Temp: 98.3 F (36.8 C)  SpO2: 95%  Weight: 227 lb (103 kg)     Physical Exam  See photo, area of erythema left inner distal leg and ankle extending beyond previous rash (which has resolved) c/w cellulitis.     Assessment & Plan    1. Cellulitis of left lower extremity  - cephALEXin (KEFLEX) 500 MG capsule; Take 1 capsule (500 mg total) by mouth 4 (four) times daily for 10 days.  Dispense: 40 capsule; Refill: 0  Call if symptoms change or if not rapidly improving.        Lelon Huh, MD  [facility]

## 2018-06-07 NOTE — Chronic Care Management (AMB) (Signed)
  Care Management   Note  06/07/2018 Name: Jesus Mack MRN: [id number] DOB: 07/27/1955  Jesus Mack is a 63 year old male who sees Dr. Lelon Huh for primary care. Dr. Caryn Section asked the CCM team to consult the patient for chronic care management, care coordination, and medication assistance secondary to patient being uninsured. Patient has a history of but not limited to COPD, Tobacco abuse, and HTN. Referral was placed 06/04/2018. Patient's last office visit was 06/04/2018.  Telephone outreach to patient today to introduce CCM services.  Jesus Mack was given information about Care Management services today including:  1. Case Management services include personalized support from designated clinical staff supervised by a physician, including individualized plan of care and coordination with other care providers 2. 24/7 contact phone numbers for assistance for urgent and routine care needs. 3. The patient may stop CCM services at any time (effective at the end of the month) by phone call to the office staff.   Patient agreed to services and verbal consent obtained.     Plan: Initial telephone encounter scheduled with CCM RN CM and CCM Pharmacist for 05/14/2018  Jesus Mack E. Rollene Rotunda, RN, BSN Nurse Care Coordinator [facility] Care Management [phone]

## 2018-06-07 NOTE — Patient Instructions (Signed)
1. Thank You for allowing the CCM (Chronic Care Management) Team to assist you with your healthcare goals!! We look forward to speaking with you on Wednesday 05/14/2018 at 9:00 3.  Contact the CCM Team if you have any question or need to reschedule your initial visit.  CCM (Chronic Care Management) Team   Trish Fountain RN, BSN Nurse Care Coordinator  [phone]  Ruben Reason PharmD  Clinical Pharmacist  [phone]   Mr. Geraci was given information about Care Management services today including:  1. Case Management services includes personalized support from designated clinical staff supervised by his physician, including individualized plan of care and coordination with other care providers 2. 24/7 contact phone numbers for assistance for urgent and routine care needs. 3. The patient may stop case management services at any time by phone call to the office staff.  Patient agreed to services and verbal consent obtained.

## 2018-06-07 NOTE — Telephone Encounter (Signed)
error 

## 2018-06-07 NOTE — Patient Instructions (Signed)
.   Please review the attached list of medications and notify my office if there are any errors.   . Please bring all of your medications to every appointment so we can make sure that our medication list is the same as yours.   

## 2018-06-12 ENCOUNTER — Encounter: Payer: Self-pay | Admitting: Family Medicine

## 2018-06-12 ENCOUNTER — Telehealth: Payer: Self-pay

## 2018-06-12 ENCOUNTER — Ambulatory Visit (INDEPENDENT_AMBULATORY_CARE_PROVIDER_SITE_OTHER): Payer: Self-pay | Admitting: Family Medicine

## 2018-06-12 ENCOUNTER — Other Ambulatory Visit: Payer: Self-pay

## 2018-06-12 ENCOUNTER — Ambulatory Visit: Payer: Self-pay

## 2018-06-12 VITALS — BP 134/83 | HR 89 | Temp 97.9°F | Resp 16 | Wt 225.0 lb

## 2018-06-12 DIAGNOSIS — I10 Essential (primary) hypertension: Secondary | ICD-10-CM

## 2018-06-12 DIAGNOSIS — L03116 Cellulitis of left lower limb: Secondary | ICD-10-CM

## 2018-06-12 DIAGNOSIS — J449 Chronic obstructive pulmonary disease, unspecified: Secondary | ICD-10-CM

## 2018-06-12 MED ORDER — DOXYCYCLINE HYCLATE 100 MG PO TABS: 100.0000 mg | ORAL_TABLET | Freq: Two times a day (BID) | ORAL | 0 refills | Status: DC

## 2018-06-12 NOTE — Chronic Care Management (AMB) (Signed)
Care Management   Initial Visit Note  06/12/2018 Name: Jesus Mack MRN: [id number] DOB: [birth year]   Subjective: "I am taking my antibiotice but I think my leg is getting worse" "I did not know I had COPD until a month ago"  Objective:  BP Readings from Last 3 Encounters:  06/12/18 134/83  06/07/18 134/82  06/04/18 138/78    Assessment: Jesus Mack is a 63 year old malewho sees Dr. Lelon Huh for primary care. Dr. Cletus Gash the CCM team to consult the patient for chronic care management, care coordination, and medication assistance secondary to patient being uninsured. Patient has a history of but not limited to COPD, Tobacco abuse, and HTN. Referral was placed3/17/2020. Patient's last office visit was 06/04/2018. Today CCM RN CM completed initial assessment via telephone and established patient health goals.   Review of patient status, including review of consultants reports, relevant laboratory and other test results, and collaboration with appropriate care team members and the patient's provider was performed as part of comprehensive patient evaluation and provision of chronic care management services.    0 ED visits/0 Hospitalizations in last 6 months   Goals Addressed            This Visit's Progress   . "I have just recently been told I have breathing problems and put on an inhaler" (pt-stated)       Current Barriers:   Knowledge deficit related to basic COPD self care/management  Knowledge deficit related to basic understanding of how to use inhalers and how inhaled medications work  Knowledge deficit related to importance of energy conservation   Nurse Case Manager Clinical Goal(s):  Over the next 14 days patient will report using inhaler  Over the next 14 days, patient will review EMMI education and plan of care provided by email and be prepared to discuss at next telephone encounter  Over the next 14 days patient will report utilizing pursed  lip breathing for shortness of breath  Over the next 14 days, patient will be able to verbalize understanding of COPD action plan and when to seek appropriate levels of medical care  Over the   Interventions:   Provided patient with basic written and verbal COPD education on self care/management/and exacerbation prevention   Provided patient with COPD action plan and reinforced importance of daily self assessment  Provided written and verbal instructions on pursed lip breathing  Assessed for medication/inhaler compliance  Provided emotional support and reassurance related to current pandemic of COVID-19  Discussed current CDC recommendations for social distancing and hand hygiene.   COPD ACTION PLAN Actions to Take if My Symptoms Get Worse   WHAT ZONE ARE YOU IN TODAY? Green, Yellow, or Red?    GREEN ZONE: I am doing well today.   Symptoms Actions .  Usual activity and exercise level . Usual amounts of cough or phlegm/musuc . Sleep well at night . Appetite is good . Continue to take daily "maintenance" medicines (the ones you take "no matter what" until your doctor adjusts them for you) . Use oxygen as prescribed . Continue regular exercise/diet plan . At all times avoid cigarette smoke, inhaled iritants     YELLOW ZONE: I am having a bad day or a COPD flare.  Symptoms Actions .  More breathless than usual . I have less energy for my daily activities . Increased or thicker phlegm/mucus . Using quick relief inhaler/nebulizer more often . More coughing than usual . I feel like I have  a "chest cold" . Poor sleep and my symptoms woke me up . My appetite is not good . My medicine is not helping . Swelling of ankles more than usual . Continue daily medications . Use quick relief inhaler or nebulizer every 4 hours . Use oxygen as prescribed . Get plenty of rest . Use pursed lip breathing . At all times avoid cigarette smoke, inhaled irritants.  [name] CALL PROVIDER for same  day or next day appointment for the following:  o If symptoms are not improving within 48 hours of onset or o If symptoms are not improving after quick relief inhaler or nebulizer o Start an oral corticosteroid (specify name, dose, and duration) . Start an antibiotic (specify name, dose, duration)    RED ZONE: I NEED URGENT MEDICAL CARE  Symptoms Actions .  Severe shortness of breath even at rest . Severe shortness of breath even after quick relief inhaler or nebulizer . Not able to lay flat because of breathing . Fever or shaking chills . Feeling confused or very drowsy . Chest pains . Coughin up blood . Quick relief inhaler or nebulizer not effective . Call 911 or have someone take you to the nearest emergency room . Call provider for now or same day appointment        Patient Self Care Activities:   Take medications as prescribed including inhalers  Practice and use pursed lip breathing for shortness of breath recovery and prevention  Self assess COPD action plan zone and make appointment with provider if you have been in the yellow zone for 48 hours without improvement.  Utilize infection prevention strategies to reduce risk of respiratory infection   Follow CDC guidelines for COVID-19 infection prevention     . I probably need to consider completing an Advanced Directive (pt-stated)       Current Barriers:  [name] Knowledge Deficits related to Need for Advanced Directives  Nurse Case Manager Clinical Goal(s):  [name] Over the next 30 days, patient will verbalize understanding of plan for Advanced Directives Completion    Interventions:  . Provided education to patient re: Advanced Directives . Provided patient with Advanced Directives packet via mail  Patient Self Care Activities:  . Patient will complete Advanced Directives  Initial goal documentation       . I think this rash on my leg is getting worse (pt-stated)       Current Barriers:  [name] Knowledge Deficits  related to understanding when to notify PCP for ongoing or worsening symptoms  Nurse Case Manager Clinical Goal(s):  [name] Over the next 7 days, patient will verbalize understanding of plan for cellulitis treatment  . Over the next 24 hours, patient will report worsening symptoms of left leg cellulitis  Interventions:  . Advised patient to notify PCP of worsening symptoms TODAY . Provided education to patient re: complications of worsening cellulitis . Reviewed medications with patient and discussed compliance with oral antibiotic therapy and ointment  Patient Self Care Activities:  . Self administers medications as prescribed . Attends all scheduled provider appointments . Calls provider office for new concerns or questions  Initial goal documentation          Follow up plan:  Telephone follow up appointment with CCM team member scheduled for: 2 weeks  The patient will call provider office today as advised to discuss worsening of left leg cellulitis.   The patient has been provided with contact information for the chronic care management team and has been  advised to call with any health related questions or concerns.   Mr. Fulop was given information about Care Management services today including:  1. Case Management services include personalized support from designated clinical staff supervised by a physician, including individualized plan of care and coordination with other care providers 2. 24/7 contact phone numbers for assistance for urgent and routine care needs. 3. The patient may stop CCM services at any time (effective at the end of the month) by phone call to the office staff.  Patient agreed to services and verbal consent obtained.  Chanah Tidmore E. Rollene Rotunda, RN, BSN Nurse Care Coordinator [facility] [phone]

## 2018-06-12 NOTE — Progress Notes (Signed)
Patient: Jesus Mack Male    DOB: Feb 13, 1956   63 y.o.   MRN: [id number] Visit Date: 06/12/2018  Today's Provider: Lelon Huh, MD   Chief Complaint  Patient presents with  . Cellulitis   Subjective:     HPI  Cellulitis: Patient was last seen for Cellulitis 5 days ago, and was started on Keflex. Patient comes in today reporting good compliance with treatment, good tolerance and poor symptom control. Although the swelling has decreased, he feels that the infection is spreading up his left leg. Pain is gone. No weakness, no fevers or chills.   No Known Allergies   Current Outpatient Medications:  .  amLODipine (NORVASC) 10 MG tablet, TAKE 1 TABLET (10 MG TOTAL) BY MOUTH DAILY., Disp: 30 tablet, Rfl: 12 .  cephALEXin (KEFLEX) 500 MG capsule, Take 1 capsule (500 mg total) by mouth 4 (four) times daily for 10 days., Disp: 40 capsule, Rfl: 0 .  [employer] ASPIRIN LOW STRENGTH 81 MG EC tablet, TAKE 1 TABLET (81 MG TOTAL) BY MOUTH DAILY., Disp: 90 tablet, Rfl: 4 .  ketoconazole (NIZORAL) 2 % cream, Apply 1 application topically 2 (two) times daily., Disp: 30 g, Rfl: 2 .  lovastatin (MEVACOR) 40 MG tablet, TAKE 1 TABLET BY MOUTH EVERY DAY, Disp: 30 tablet, Rfl: 11 .  Multiple Vitamin (MULTIVITAMIN) tablet, Take 1 tablet by mouth daily., Disp: , Rfl:  .  Tiotropium Bromide Monohydrate (SPIRIVA RESPIMAT) 2.5 MCG/ACT AERS, Inhale 2 puffs into the lungs daily., Disp: 3 Inhaler, Rfl: 4 .  triamcinolone cream (KENALOG) 0.1 %, Apply sparingly to affected area twice daily as needed, Disp: 30 g, Rfl: 1  Review of Systems  Constitutional: Negative for appetite change, chills and fever.  Respiratory: Negative for chest tightness, shortness of breath and wheezing.   Cardiovascular: Positive for leg swelling (left leg). Negative for chest pain and palpitations.  Gastrointestinal: Negative for abdominal pain, nausea and vomiting.  Skin: Positive for color change.    Social History   Tobacco  Use  . Smoking status: Former Smoker    Packs/day: 1.00    Years: 45.00    Pack years: 45.00    Types: Cigarettes    Start date: 03/20/1973    Last attempt to quit: 06/26/2017    Years since quitting: 0.9  . Smokeless tobacco: Never Used  . Tobacco comment: started smoking as teenager ut 1 ppd until 63yo  Substance Use Topics  . Alcohol use: No    Alcohol/week: 0.0 standard drinks      Objective:   BP 134/83 (BP Location: Left Arm, Patient Position: Sitting, Cuff Size: Large)   Pulse 89   Temp 97.9 F (36.6 C) (Oral)   Resp 16   Wt 225 lb (102.1 kg)   SpO2 99% Comment: room air  BMI 36.32 kg/m  Vitals:   06/12/18 1100  BP: 134/83  Pulse: 89  Resp: 16  Temp: 97.9 F (36.6 C)  TempSrc: Oral  SpO2: 99%  Weight: 225 lb (102.1 kg)     Physical Exam  Dull red erythema much less intense then exam from 06/07/2018, but border of affected area spread a few cm proximal to previous borders.     Assessment & Plan    1. Cellulitis of left lower extremity Pain, swelling and intensity of erythema are improved, but area of erythema extended a bit from last visit when he was started on cephalexin, will change to - doxycycline (VIBRA-TABS) 100 MG  tablet; Take 1 tablet (100 mg total) by mouth 2 (two) times daily.  Dispense: 20 tablet; Refill: 0 Call if symptoms change or if not rapidly improving.        Lelon Huh, MD  [facility]

## 2018-06-12 NOTE — Patient Instructions (Addendum)
Thank you allowing the Chronic Care Management Team to be a part of your care! It was a pleasure speaking with you today!  1. PLEASE call Dr. Sabino Snipes office today to discuss worsening cellulitis symptoms! 2. Keep left leg elevated to reduce swelling 3. Take all medications as prescribed including antibiotic and utilize topical medication as prescribed 4. Please review all information provided to you via email and be prepared to discuss in 2 weeks 5. Look for email from [facility]. This will include videos for you to watch. 6. Pay close attention to COPD action plan and call Dr. Caryn Section if you are in the yellow zone. 7. Call CCM RN CM with any question you may have prior to scheduled phone call in 2 weeks. 8. Follow CDC guidelines for COVID 19 including symptom reporting, social distancing, and hand hygiene  Handwashing is one of the best ways to protect yourself and your family from getting sick. Wash Your Hands Often to Stay Healthy  When: You can help yourself and your loved ones stay healthy by washing your hands often, especially during these key times when you are likely to get and spread germs: . Before, during, and after preparing food  . Before eating food  . Before and after caring for someone at home who is sick with vomiting or diarrhea  . Before and after treating a cut or wound  . After using the toilet  . After being in public spaces . After changing diapers or cleaning up a child who has used the toilet  . After blowing your nose, coughing, or sneezing  . After touching an animal, animal feed, or animal waste  . After handling pet food or pet treats  . After touching garbage . After touching public doors, telephones, shopping carts, or other surfaces .  Five Steps to [place] the Right Way 1. Wet your hands with clean, running water (warm or cold), turn off the tap, and apply soap. 2. Lather your hands by rubbing them together with the soap. Lather the backs of your  hands, between your fingers, and under your nails. 3. Scrub your hands for at least 20 seconds. Need a timer? Hum the "Happy Birthday" song from beginning to end twice. 4. Rinse your hands well under clean, running water. 5. Dry your hands using a clean towel or air dry them.  Source: BridgeDigest.is  For information about COVID-19 or "Corona Virus", the following web resources may be helpful:  CDC: BeginnerSteps.be    [place]:  InsuranceIntern.se    CCM (Chronic Care Management) Team   Trish Fountain RN, BSN Nurse Care Coordinator  [phone]  Ruben Reason PharmD  Clinical Pharmacist  [phone]   [place], [place] Social Worker [phone]  Goals Addressed            This Visit's Progress   . "I have just recently been told I have breathing problems and put on an inhaler" (pt-stated)       Current Barriers:   Knowledge deficit related to basic COPD self care/management  Knowledge deficit related to basic understanding of how to use inhalers and how inhaled medications work  Knowledge deficit related to importance of energy conservation   Nurse Case Manager Clinical Goal(s):  Over the next 14 days patient will report using inhaler  Over the next 14 days, patient will review EMMI education and plan of care provided by email and be prepared to discuss at next telephone encounter  Over the next 14 days patient will report utilizing pursed lip breathing for shortness of breath  Over the next 14 days, patient will be able to verbalize understanding of COPD action plan and when to seek appropriate levels of medical care  Over the   Interventions:   Provided patient with basic written and verbal COPD education on self care/management/and exacerbation prevention    Provided patient with COPD action plan and reinforced importance of daily self assessment  Provided written and verbal instructions on pursed lip breathing  Assessed for medication/inhaler compliance  Provided emotional support and reassurance related to current pandemic of COVID-19  Discussed current CDC recommendations for social distancing and hand hygiene.   COPD ACTION PLAN Actions to Take if My Symptoms Get Worse   WHAT ZONE ARE YOU IN TODAY? Green, Yellow, or Red?    GREEN ZONE: I am doing well today.   Symptoms Actions .  Usual activity and exercise level . Usual amounts of cough or phlegm/musuc . Sleep well at night . Appetite is good . Continue to take daily "maintenance" medicines (the ones you take "no matter what" until your doctor adjusts them for you) . Use oxygen as prescribed . Continue regular exercise/diet plan . At all times avoid cigarette smoke, inhaled iritants     YELLOW ZONE: I am having a bad day or a COPD flare.  Symptoms Actions .  More breathless than usual . I have less energy for my daily activities . Increased or thicker phlegm/mucus . Using quick relief inhaler/nebulizer more often . More coughing than usual . I feel like I have a "chest cold" . Poor sleep and my symptoms woke me up . My appetite is not good . My medicine is not helping . Swelling of ankles more than usual . Continue daily medications . Use quick relief inhaler or nebulizer every 4 hours . Use oxygen as prescribed . Get plenty of rest . Use pursed lip breathing . At all times avoid cigarette smoke, inhaled irritants.  Jesus Mack CALL PROVIDER for same day or next day appointment for the following:  o If symptoms are not improving within 48 hours of onset or o If symptoms are not improving after quick relief inhaler or nebulizer o Start an oral corticosteroid (specify name, dose, and duration) . Start an antibiotic (specify name, dose, duration)    RED ZONE: I NEED URGENT  MEDICAL CARE  Symptoms Actions .  Severe shortness of breath even at rest . Severe shortness of breath even after quick relief inhaler or nebulizer . Not able to lay flat because of breathing . Fever or shaking chills . Feeling confused or very drowsy . Chest pains . Coughin up blood . Quick relief inhaler or nebulizer not effective . Call 911 or have someone take you to the nearest emergency room . Call provider for now or same day appointment        Patient Self Care Activities:   Take medications as prescribed including inhalers  Practice and use pursed lip breathing for shortness of breath recovery and prevention  Self assess COPD action plan zone and make appointment with provider if you have been in the yellow zone for 48 hours without improvement.  Utilize infection prevention strategies to reduce risk of respiratory infection   Follow CDC guidelines for COVID-19 infection prevention     . I probably need to consider completing an Advanced Directive (pt-stated)       Current Barriers:  Jesus Mack Knowledge Deficits  related to Need for Advanced Directives  Nurse Case Manager Clinical Goal(s):  Jesus Mack Over the next 30 days, patient will verbalize understanding of plan for Advanced Directives Completion    Interventions:  . Provided education to patient re: Advanced Directives . Provided patient with Advanced Directives packet via mail  Patient Self Care Activities:  . Patient will complete Advanced Directives  Initial goal documentation       . I think this rash on my leg is getting worse (pt-stated)       Current Barriers:  Jesus Mack Knowledge Deficits related to understanding when to notify PCP for ongoing or worsening symptoms  Nurse Case Manager Clinical Goal(s):  Jesus Mack Over the next 7 days, patient will verbalize understanding of plan for cellulitis treatment  . Over the next 24 hours, patient will report worsening symptoms of left leg cellulitis  Interventions:  . Advised  patient to notify PCP of worsening symptoms TODAY . Provided education to patient re: complications of worsening cellulitis . Reviewed medications with patient and discussed compliance with oral antibiotic therapy and ointment  Patient Self Care Activities:  . Self administers medications as prescribed . Attends all scheduled provider appointments . Calls provider office for new concerns or questions  Initial goal documentation         Print copy of patient instructions provided.    Telephone follow up appointment with CCM team member scheduled for: 2 weeks  The patient will call provider office today as advised to discuss worsening of left leg cellulitis.   The patient has been provided with contact information for the chronic care management team and has been advised to call with any health related questions or concerns.    Chronic Obstructive Pulmonary Disease Chronic obstructive pulmonary disease (COPD) is a long-term (chronic) lung problem. When you have COPD, it is hard for air to get in and out of your lungs. Usually the condition gets worse over time, and your lungs will never return to normal. There are things you can do to keep yourself as healthy as possible.  Your doctor may treat your condition with: ? Medicines. ? Oxygen. ? Lung surgery.  Your doctor may also recommend: ? Rehabilitation. This includes steps to make your body work better. It may involve a team of specialists. ? Quitting smoking, if you smoke. ? Exercise and changes to your diet. ? Comfort measures (palliative care). Follow these instructions at home: Medicines  Take over-the-counter and prescription medicines only as told by your doctor.  Talk to your doctor before taking any cough or allergy medicines. You may need to avoid medicines that cause your lungs to be dry. Lifestyle  If you smoke, stop. Smoking makes the problem worse. If you need help quitting, ask your doctor.  Avoid being  around things that make your breathing worse. This may include smoke, chemicals, and fumes.  Stay active, but remember to rest as well.  Learn and use tips on how to relax.  Make sure you get enough sleep. Most adults need at least 7 hours of sleep every night.  Eat healthy foods. Eat smaller meals more often. Rest before meals. Controlled breathing (Pursed lip breathing) Learn and use tips on how to control your breathing as told by your doctor. Try:  Breathing in (inhaling) through your nose for 1 second. Then, pucker your lips and breath out (exhale) through your lips for 2 seconds.  Controlled coughing Learn and use controlled coughing to clear mucus from your lungs. Follow these  steps: 1. Lean your head a little forward. 2. Breathe in deeply. 3. Try to hold your breath for 3 seconds. 4. Keep your mouth slightly open while coughing 2 times. 5. Spit any mucus out into a tissue. 6. Rest and do the steps again 1 or 2 times as needed. General instructions  Make sure you get all the shots (vaccines) that your doctor recommends. Ask your doctor about a flu shot and a pneumonia shot.  Use oxygen therapy and pulmonary rehabilitation if told by your doctor. If you need home oxygen therapy, ask your doctor if you should buy a tool to measure your oxygen level (oximeter).  Make a COPD action plan with your doctor. This helps you to know what to do if you feel worse than usual.  Manage any other conditions you have as told by your doctor.  Avoid going outside when it is very hot, cold, or humid.  Avoid people who have a sickness you can catch (contagious).  Keep all follow-up visits as told by your doctor. This is important. Contact a doctor if: (Contact same day you develop symptoms)  You cough up more mucus than usual.  There is a change in the color or thickness of the mucus.  It is harder to breathe than usual.  Your breathing is faster than usual.  You have trouble  sleeping.  You need to use your medicines more often than usual.  You have trouble doing your normal activities such as getting dressed or walking around the house. Get help right away if: (CALL 911)  You have shortness of breath while resting.  You have shortness of breath that stops you from: ? Being able to talk. ? Doing normal activities.  Your chest hurts for longer than 5 minutes.  Your skin color is more blue than usual.  Your pulse oximeter shows that you have low oxygen for longer than 5 minutes.  You have a fever.  You feel too tired to breathe normally. Summary  Chronic obstructive pulmonary disease (COPD) is a long-term lung problem.  The way your lungs work will never return to normal. Usually the condition gets worse over time. There are things you can do to keep yourself as healthy as possible.  Take over-the-counter and prescription medicines only as told by your doctor.  If you smoke, stop. Smoking makes the problem worse.    Pursed Lip Breathing Pursed lip breathing is a technique to relieve the feeling of being short of breath. Some long-term respiratory conditions, like chronic obstructive pulmonary disease (COPD) and severe asthma, can make it hard to breathe out (exhale) all of the air in your lungs. This can make air that has less oxygen than normal build up in your lungs (air trapping). Trapped air means your lungs fill with less fresh air when you breathe in (inhale). As a result, you feel short of breath. Pursed lip breathing keeps your airways open longer when you exhale and empties more air from your lungs. This makes more space for fresh air when you inhale. Pursed lip breathing can also slow down your breathing and help your body not have to work so hard to breathe. Over time, pursed lip breathing may help you be able to be more physically active and do more activities. How to perform pursed lip breathing Being short of breath can make you tense and  anxious. Before you start this breathing exercise, take a minute to relax your shoulders and close your eyes. Then: 1.  Start the exercise by closing your mouth. 2. Breathe in through your nose, taking a normal breath. You can do this at your normal rate of breathing. If you feel you are not getting enough air, breathe in while slowly counting to 2 or 3. 3. Pucker (purse) your lips as if you were going to whistle. 4. Gently tighten your abdomen muscles or press on your belly to help push the air out. 5. Breathe out slowly through your pursed lips. Take at least twice as long to breathe out as it takes you to breathe in. 6. Make sure that you breathe out all of the air, but do not force air out. 7. Repeat the exercise until your breathing improves. Ask your health care provider how often and how long to do this exercise. Follow these instructions at home:  Take over-the-counter and prescription medicines only as told by your health care provider.  Return to your normal activities as told by your health care provider. Ask your health care provider what activities are safe for you.  Do not use any products that contain nicotine or tobacco, such as cigarettes and e-cigarettes. If you need help quitting, ask your health care provider.  Keep all follow-up visits as told by your health care provider. This is important. Contact a health care provider if:  Your shortness of breath gets worse.  You become less able to exercise or be active.  You develop a cough.  You develop a fever. Get help right away if:  You are struggling to breathe.  Your shortness of breath prevents you from engaging in any activity. Summary  Pursed lip breathing is a breathing technique that helps to remove trapped air from your lungs. It helps you get more oxygen into your lungs and makes your body have to work less hard to breathe.  Pursed lip breathing can gradually make you more able to be physically  active.  You can do pursed lip breathing on your own at home.  Ask your health care provider how often and how long you should do pursed lip breathing.   Cellulitis, Adult  Cellulitis is a skin infection. The infected area is often warm, red, swollen, and sore. It occurs most often in the arms and lower legs. It is very important to get treated for this condition. What are the causes? This condition is caused by bacteria. The bacteria enter through a break in the skin, such as a cut, burn, insect bite, open sore, or crack. What increases the risk? This condition is more likely to occur in people who:  Have a weak body defense system (immune system).  Have open cuts, burns, bites, or scrapes on the skin.  Are older than 63 years of age.  Have a blood sugar problem (diabetes).  Have a long-lasting (chronic) liver disease (cirrhosis) or kidney disease.  Are very overweight (obese).  Have a skin problem, such as: ? Itchy rash (eczema). ? Slow movement of blood in the veins (venous stasis). ? Fluid buildup below the skin (edema).  Have been treated with high-energy rays (radiation).  Use IV drugs. What are the signs or symptoms? Symptoms of this condition include:  Skin that is: ? Red. ? Streaking. ? Spotting. ? Swollen. ? Sore or painful when you touch it. ? Warm.  A fever.  Chills.  Blisters. How is this diagnosed? This condition is diagnosed based on:  Medical history.  Physical exam.  Blood tests.  Imaging tests. How is this treated?  Treatment for this condition may include:  Medicines to treat infections or allergies.  Home care, such as: ? Rest. ? Placing cold or warm cloths (compresses) on the skin.  Hospital care, if the condition is very bad. Follow these instructions at home: Medicines  Take over-the-counter and prescription medicines only as told by your doctor.  If you were prescribed an antibiotic medicine, take it as told by your  doctor. Do not stop taking it even if you start to feel better. General instructions   Drink enough fluid to keep your pee (urine) pale yellow.  Do not touch or rub the infected area.  Raise (elevate) the infected area above the level of your heart while you are sitting or lying down.  Place cold or warm cloths on the area as told by your doctor.  Keep all follow-up visits as told by your doctor. This is important. Contact a doctor if:  You have a fever.  You do not start to get better after 1-2 days of treatment.  Your bone or joint under the infected area starts to hurt after the skin has healed.  Your infection comes back. This can happen in the same area or another area.  You have a swollen bump in the area.  You have new symptoms.  You feel ill and have muscle aches and pains. Get help right away if:  Your symptoms get worse.  You feel very sleepy.  You throw up (vomit) or have watery poop (diarrhea) for a long time.  You see red streaks coming from the area.  Your red area gets larger.  Your red area turns dark in color. These symptoms may represent a serious problem that is an emergency. Do not wait to see if the symptoms will go away. Get medical help right away. Call your local emergency services (911 in the [place] Do not drive yourself to the hospital. Summary  Cellulitis is a skin infection. The area is often warm, red, swollen, and sore.  This condition is treated with medicines, rest, and cold and warm cloths.  Take all medicines only as told by your doctor.  Tell your doctor if symptoms do not start to get better after 1-2 days of treatment.  This information is not intended to replace advice given to you by your health care provider. Make sure you discuss any questions you have with your health care provider. Document Released: 08/23/2007 Document Revised: 07/26/2017 Document Reviewed: 07/26/2017 Elsevier Interactive Patient Education  2019  [employer].

## 2018-06-12 NOTE — Patient Instructions (Signed)
.   Please review the attached list of medications and notify my office if there are any errors.   . Please bring all of your medications to every appointment so we can make sure that our medication list is the same as yours.   

## 2018-06-14 ENCOUNTER — Telehealth: Payer: Self-pay

## 2018-06-17 ENCOUNTER — Encounter: Payer: Self-pay | Admitting: Pharmacist

## 2018-06-20 ENCOUNTER — Telehealth: Payer: Self-pay | Admitting: Family Medicine

## 2018-06-20 NOTE — Telephone Encounter (Signed)
Please check with patient and see if infection in leg is doing better since we changed antibiotic to doxycycline. If not completed healed we can sen in another weeks worth of antibiotic.

## 2018-06-21 NOTE — Telephone Encounter (Signed)
Patient states that he has had improvement since being on Doxycycline and is doing well.KW

## 2018-06-26 ENCOUNTER — Ambulatory Visit: Payer: Self-pay

## 2018-06-26 ENCOUNTER — Other Ambulatory Visit: Payer: Self-pay

## 2018-06-26 DIAGNOSIS — J449 Chronic obstructive pulmonary disease, unspecified: Secondary | ICD-10-CM

## 2018-06-26 DIAGNOSIS — I10 Essential (primary) hypertension: Secondary | ICD-10-CM

## 2018-06-26 DIAGNOSIS — E785 Hyperlipidemia, unspecified: Secondary | ICD-10-CM

## 2018-06-26 NOTE — Chronic Care Management (AMB) (Signed)
.  Care Management   Follow Up Note   06/26/2018 Name: Jesus Mack MRN: [id number] DOB: [birth year]  Referred by: Birdie Sons, MD Reason for referral : Chronic Care Management (2 week follow up DM/leg cellulitis)    Subjective: "I found the videos you sent me on how to use my inhaler very helpful" "I was not using it every day, now I am"   Objective:  BP Readings from Last 3 Encounters:  06/12/18 134/83  06/07/18 134/82  06/04/18 138/78     Assessment: Jesus Mack a 63year old malewho seesDr. Terrilyn Saver primary care. Dr. Cletus Gash the CCM team to consult the patient forchronic care management, care coordination, and medication assistance secondary to patient being uninsured. Patient has a history of but not limited toCOPD, Tobacco abuse, and HTN.Referral was placed3/17/2020. Patient's last office visit was3/17/2020. CCM RN CM completed initial assessment via telephone and established patient health goals on 3/35/2020. Today CCM RN CM followed up with [name] to discuss progression toward goals.  Review of patient status, including review of consultants reports, relevant laboratory and other test results, and collaboration with appropriate care team members and the patient's provider was performed as part of comprehensive patient evaluation and provision of chronic care management services.    Goals Addressed            This Visit's Progress   . COMPLETED: "I have just recently been told I have breathing problems and put on an inhaler" (pt-stated)       Mr. Jesus Mack is very appreciative of the education materials sent to him by CCM RN CM. He states it was very informative and "I learned a lot". He states he was not using his maintenance inhaler correctly until "watching the video". He feels he better understands his COPD and more prepared to manage.   Current Barriers:   Knowledge deficit related to basic COPD self care/management  Knowledge  deficit related to basic understanding of how to use inhalers and how inhaled medications work  Knowledge deficit related to importance of energy conservation   Nurse Case Manager Clinical Goal(s):  Over the next 14 days patient will report using inhaler  Over the next 14 days, patient will review EMMI education and plan of care provided by email and be prepared to discuss at next telephone encounter  Over the next 14 days patient will report utilizing pursed lip breathing for shortness of breath  Over the next 14 days, patient will be able to verbalize understanding of COPD action plan and when to seek appropriate levels of medical care  Over the   Interventions:   Provided patient with basic written and verbal COPD education on self care/management/and exacerbation prevention   Provided patient with COPD action plan and reinforced importance of daily self assessment  Provided written and verbal instructions on pursed lip breathing  Assessed for medication/inhaler compliance  Provided emotional support and reassurance related to current pandemic of COVID-19  Discussed current CDC recommendations for social distancing and hand hygiene.   COPD ACTION PLAN Actions to Take if My Symptoms Get Worse   WHAT ZONE ARE YOU IN TODAY? Green, Yellow, or Red?    GREEN ZONE: I am doing well today.   Symptoms Actions .  Usual activity and exercise level . Usual amounts of cough or phlegm/musuc . Sleep well at night . Appetite is good . Continue to take daily "maintenance" medicines (the ones you take "no matter what" until your doctor adjusts  them for you) . Use oxygen as prescribed . Continue regular exercise/diet plan . At all times avoid cigarette smoke, inhaled iritants     YELLOW ZONE: I am having a bad day or a COPD flare.  Symptoms Actions .  More breathless than usual . I have less energy for my daily activities . Increased or thicker phlegm/mucus . Using quick relief  inhaler/nebulizer more often . More coughing than usual . I feel like I have a "chest cold" . Poor sleep and my symptoms woke me up . My appetite is not good . My medicine is not helping . Swelling of ankles more than usual . Continue daily medications . Use quick relief inhaler or nebulizer every 4 hours . Use oxygen as prescribed . Get plenty of rest . Use pursed lip breathing . At all times avoid cigarette smoke, inhaled irritants.  Jesus Mack CALL PROVIDER for same day or next day appointment for the following:  o If symptoms are not improving within 48 hours of onset or o If symptoms are not improving after quick relief inhaler or nebulizer o Start an oral corticosteroid (specify name, dose, and duration) . Start an antibiotic (specify name, dose, duration)    RED ZONE: I NEED URGENT MEDICAL CARE  Symptoms Actions .  Severe shortness of breath even at rest . Severe shortness of breath even after quick relief inhaler or nebulizer . Not able to lay flat because of breathing . Fever or shaking chills . Feeling confused or very drowsy . Chest pains . Coughin up blood . Quick relief inhaler or nebulizer not effective . Call 911 or have someone take you to the nearest emergency room . Call provider for now or same day appointment        Patient Self Care Activities:   Take medications as prescribed including inhalers  Practice and use pursed lip breathing for shortness of breath recovery and prevention  Self assess COPD action plan zone and make appointment with provider if you have been in the yellow zone for 48 hours without improvement.  Utilize infection prevention strategies to reduce risk of respiratory infection   Follow CDC guidelines for COVID-19 infection prevention     . COMPLETED: I probably need to consider completing an Advanced Directive (pt-stated)       Current Barriers:  Jesus Mack Knowledge Deficits related to Need for Advanced Directives  Nurse Case Manager Clinical  Goal(s):  Jesus Mack Over the next 30 days, patient will verbalize understanding of plan for Advanced Directives Completion    Interventions:  . Provided education to patient re: Advanced Directives . Provided patient with Advanced Directives packet via mail  Patient Self Care Activities:  . Patient will complete Advanced Directives  Initial goal documentation    . COMPLETED: I think this rash on my leg is getting worse (pt-stated)       Mr. Tonne continues to have a "rash" on his legs however states the "rash" is getting better with antibiotics. He is keeping his leg elevated as prescribed and states elevation prevents swelling. Per EMR, this "rash" is cellulitis. He does not have compression stocking but has been advised to purchase. He continues to be followed by PCP.   Current Barriers:  Jesus Mack Knowledge Deficits related to understanding when to notify PCP for ongoing or worsening symptoms  Nurse Case Manager Clinical Goal(s):  Jesus Mack Over the next 7 days, patient will verbalize understanding of plan for cellulitis treatment  . Over the next 24 hours, patient  will report worsening symptoms of left leg cellulitis  Interventions:  . Advised patient to notify PCP of worsening symptoms TODAY . Provided education to patient re: complications of worsening cellulitis . Reviewed medications with patient and discussed compliance with oral antibiotic therapy and ointment  Patient Self Care Activities:  . Self administers medications as prescribed . Attends all scheduled provider appointments . Calls provider office for new concerns or questions  Initial goal documentation      . It is a struggle paying for my medication some times (pt-stated)       Mr.Matar is currently uninsured. He is receiving 1200.00/month from his retirement but this is not enough to cover billis and medications every month. He is appreciative of any help or assistance he could get to make his medications more affordable.   Current Barriers:  . Film/video editor.  . Difficulty obtaining medications  . Lack of insurance  Nurse Case Manager Clinical Goal(s):  Jesus Mack Over the next 14 days, patient will work with [facility] (community agency) to establish care and medication assistance if meets qualifications  Interventions:  . Provided patient with contact information for [facility] and discuss qualification as provided by Anderson Malta ([facility] staff)  Patient Self Care Activities:  . Self administers medications as prescribed . Attends all scheduled provider appointments  Initial goal documentation         Follow Up Plan: Mr. Rivero will follow up with CCM RN CM once he engages with [facility]   Daleigh Pollinger E. Rollene Rotunda, RN, BSN Nurse Care Coordinator [facility] Care Management [phone]

## 2018-06-26 NOTE — Patient Instructions (Signed)
Thank you allowing the Chronic Care Management Team to be a part of your care! It was a pleasure speaking with you today!  1. Continue to review the education materials I sent you as needed. I am glad it was very helpful to you. 2. Continue to take your medications as prescribed 3.   Keep left leg elevated to reduce swelling 4.   Continue toTake all medications as prescribed including antibiotic and utilize topical medication as prescribed 5.   Pay close attention to COPD action plan and call Dr. Caryn Section if you are in the yellow zone. 6.   Follow CDC guidelines for COVID 19 including symptom reporting, social distancing, and hand hygiene  CCM (Chronic Care Management) Team   Trish Fountain RN, BSN Nurse Care Coordinator  [phone]  Ruben Reason PharmD  Clinical Pharmacist  [phone]   [place], LCSW Clinical Social Worker [phone]  Goals Addressed            This Visit's Progress   . COMPLETED: "I have just recently been told I have breathing problems and put on an inhaler" (pt-stated)       Current Barriers:   Knowledge deficit related to basic COPD self care/management  Knowledge deficit related to basic understanding of how to use inhalers and how inhaled medications work  Knowledge deficit related to importance of energy conservation   Nurse Case Manager Clinical Goal(s):  Over the next 14 days patient will report using inhaler  Over the next 14 days, patient will review EMMI education and plan of care provided by email and be prepared to discuss at next telephone encounter  Over the next 14 days patient will report utilizing pursed lip breathing for shortness of breath  Over the next 14 days, patient will be able to verbalize understanding of COPD action plan and when to seek appropriate levels of medical care  Over the   Interventions:   Provided patient with basic written and verbal COPD education on self care/management/and exacerbation  prevention   Provided patient with COPD action plan and reinforced importance of daily self assessment  Provided written and verbal instructions on pursed lip breathing  Assessed for medication/inhaler compliance  Provided emotional support and reassurance related to current pandemic of COVID-19  Discussed current CDC recommendations for social distancing and hand hygiene.   COPD ACTION PLAN Actions to Take if My Symptoms Get Worse   WHAT ZONE ARE YOU IN TODAY? Green, Yellow, or Red?    GREEN ZONE: I am doing well today.   Symptoms Actions .  Usual activity and exercise level . Usual amounts of cough or phlegm/musuc . Sleep well at night . Appetite is good . Continue to take daily "maintenance" medicines (the ones you take "no matter what" until your doctor adjusts them for you) . Use oxygen as prescribed . Continue regular exercise/diet plan . At all times avoid cigarette smoke, inhaled iritants     YELLOW ZONE: I am having a bad day or a COPD flare.  Symptoms Actions .  More breathless than usual . I have less energy for my daily activities . Increased or thicker phlegm/mucus . Using quick relief inhaler/nebulizer more often . More coughing than usual . I feel like I have a "chest cold" . Poor sleep and my symptoms woke me up . My appetite is not good . My medicine is not helping . Swelling of ankles more than usual . Continue daily medications . Use quick relief inhaler  or nebulizer every 4 hours . Use oxygen as prescribed . Get plenty of rest . Use pursed lip breathing . At all times avoid cigarette smoke, inhaled irritants.  [name] CALL PROVIDER for same day or next day appointment for the following:  o If symptoms are not improving within 48 hours of onset or o If symptoms are not improving after quick relief inhaler or nebulizer o Start an oral corticosteroid (specify name, dose, and duration) . Start an antibiotic (specify name, dose, duration)    RED ZONE: I  NEED URGENT MEDICAL CARE  Symptoms Actions .  Severe shortness of breath even at rest . Severe shortness of breath even after quick relief inhaler or nebulizer . Not able to lay flat because of breathing . Fever or shaking chills . Feeling confused or very drowsy . Chest pains . Coughin up blood . Quick relief inhaler or nebulizer not effective . Call 911 or have someone take you to the nearest emergency room . Call provider for now or same day appointment        Patient Self Care Activities:   Take medications as prescribed including inhalers  Practice and use pursed lip breathing for shortness of breath recovery and prevention  Self assess COPD action plan zone and make appointment with provider if you have been in the yellow zone for 48 hours without improvement.  Utilize infection prevention strategies to reduce risk of respiratory infection   Follow CDC guidelines for COVID-19 infection prevention     . COMPLETED: I probably need to consider completing an Advanced Directive (pt-stated)       Current Barriers:  [name] Knowledge Deficits related to Need for Advanced Directives  Nurse Case Manager Clinical Goal(s):  [name] Over the next 30 days, patient will verbalize understanding of plan for Advanced Directives Completion    Interventions:  . Provided education to patient re: Advanced Directives . Provided patient with Advanced Directives packet via mail  Patient Self Care Activities:  . Patient will complete Advanced Directives  Initial goal documentation       . COMPLETED: I think this rash on my leg is getting worse (pt-stated)       Current Barriers:  [name] Knowledge Deficits related to understanding when to notify PCP for ongoing or worsening symptoms  Nurse Case Manager Clinical Goal(s):  [name] Over the next 7 days, patient will verbalize understanding of plan for cellulitis treatment  . Over the next 24 hours, patient will report worsening symptoms of left leg  cellulitis  Interventions:  . Advised patient to notify PCP of worsening symptoms TODAY . Provided education to patient re: complications of worsening cellulitis . Reviewed medications with patient and discussed compliance with oral antibiotic therapy and ointment  Patient Self Care Activities:  . Self administers medications as prescribed . Attends all scheduled provider appointments . Calls provider office for new concerns or questions  Initial goal documentation      . 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 Lack of insurance  Nurse Case Manager Clinical Goal(s):  [name] Over the next 14 days, patient will work with [facility] (community agency) to establish care and medication assistance if meets qualifications  Interventions:  . Provided patient with contact information for [facility] and discuss qualification as provided by Anderson Malta ([facility] staff)  Patient Self Care Activities:  .  Self administers medications as prescribed . Attends all scheduled provider appointments  Initial goal documentation        The patient verbalized understanding of instructions provided today and declined a print copy of patient instruction materials.   The patient will call CCM RN CM* as advised to inform if he is able to utilize [facility] for medication management and PCP care.

## 2018-08-04 ENCOUNTER — Other Ambulatory Visit: Payer: Self-pay | Admitting: Family Medicine

## 2018-09-11 NOTE — Progress Notes (Signed)
This encounter was created in error - please disregard.

## 2018-12-06 ENCOUNTER — Encounter: Payer: Self-pay | Admitting: Family Medicine

## 2018-12-06 ENCOUNTER — Other Ambulatory Visit: Payer: Self-pay

## 2018-12-06 ENCOUNTER — Ambulatory Visit (INDEPENDENT_AMBULATORY_CARE_PROVIDER_SITE_OTHER): Payer: Self-pay | Admitting: Family Medicine

## 2018-12-06 VITALS — BP 132/80 | HR 84 | Temp 98.6°F | Resp 16 | Ht 67.0 in | Wt 225.0 lb

## 2018-12-06 DIAGNOSIS — R21 Rash and other nonspecific skin eruption: Secondary | ICD-10-CM

## 2018-12-06 DIAGNOSIS — I10 Essential (primary) hypertension: Secondary | ICD-10-CM

## 2018-12-06 MED ORDER — SULFAMETHOXAZOLE-TRIMETHOPRIM 800-160 MG PO TABS: 1.0000 | ORAL_TABLET | Freq: Two times a day (BID) | ORAL | 0 refills | Status: AC

## 2018-12-06 NOTE — Patient Instructions (Signed)
.   Please review the attached list of medications and notify my office if there are any errors.   . Please bring all of your medications to every appointment so we can make sure that our medication list is the same as yours.   . It is especially important to get the annual flu vaccine this year. If you haven't had it already, please go to your pharmacy or call the office as soon as possible to schedule you flu shot.  

## 2018-12-06 NOTE — Progress Notes (Signed)
Patient: Jesus Mack Male    DOB: [birth year]   63 y.o.   MRN: [id number] Visit Date: 12/06/2018  Today's Provider: Lelon Huh, MD   Chief Complaint  Patient presents with  . Hypertension  . Hyperlipidemia  . Rash   Subjective:   HPI  Hypertension, follow-up:  BP Readings from Last 3 Encounters:  12/06/18 132/80  06/12/18 134/83  06/07/18 134/82    He was last seen for hypertension 6 months ago.  BP at that visit was 134/83. Management since that visit includes no changes. He reports good compliance with treatment. He is not having side effects.  He is not exercising. He is adherent to low salt diet.   Outside blood pressures are checked occasionally. He is experiencing none.  Patient denies exertional chest pressure/discomfort and palpitations.   Cardiovascular risk factors include dyslipidemia.   Weight trend: stable Wt Readings from Last 3 Encounters:  12/06/18 225 lb (102.1 kg)  06/12/18 225 lb (102.1 kg)  06/07/18 227 lb (103 kg)    Current diet: well balanced   Lipid/Cholesterol, Follow-up:   Last seen for this6 months ago.  Management changes since that visit include no changes. . Last Lipid Panel:    Component Value Date/Time   CHOL 174 08/21/2017 0851   TRIG 65 08/21/2017 0851   HDL 64 08/21/2017 0851   CHOLHDL 2.7 08/21/2017 0851   LDLCALC 97 08/21/2017 0851    Risk factors for vascular disease include hypertension  He reports good compliance with treatment. He is not having side effects.  Current symptoms include none and have been stable.   Cellulitis Patient reports that he still has swelling and a rash on both lower extremities. He has been using ketoconazole cream with no relief. Was previously treated with doxycycline and reports he did see improvement with antibiotic, but never completely cleared.     No Known Allergies   Current Outpatient Medications:  .  amLODipine (NORVASC) 10 MG tablet, TAKE 1 TABLET (10  MG TOTAL) BY MOUTH DAILY., Disp: 90 tablet, Rfl: 4 .  [employer] ASPIRIN LOW STRENGTH 81 MG EC tablet, TAKE 1 TABLET (81 MG TOTAL) BY MOUTH DAILY., Disp: 90 tablet, Rfl: 4 .  doxycycline (VIBRA-TABS) 100 MG tablet, Take 1 tablet (100 mg total) by mouth 2 (two) times daily., Disp: 20 tablet, Rfl: 0 .  ketoconazole (NIZORAL) 2 % cream, Apply 1 application topically 2 (two) times daily., Disp: 30 g, Rfl: 2 .  lovastatin (MEVACOR) 40 MG tablet, TAKE 1 TABLET BY MOUTH EVERY DAY, Disp: 90 tablet, Rfl: 3 .  Multiple Vitamin (MULTIVITAMIN) tablet, Take 1 tablet by mouth daily., Disp: , Rfl:  .  Tiotropium Bromide Monohydrate (SPIRIVA RESPIMAT) 2.5 MCG/ACT AERS, Inhale 2 puffs into the lungs daily., Disp: 3 Inhaler, Rfl: 4 .  triamcinolone cream (KENALOG) 0.1 %, Apply sparingly to affected area twice daily as needed, Disp: 30 g, Rfl: 1  Review of Systems  Constitutional: Negative for activity change and fatigue.  Respiratory: Negative for cough and shortness of breath.   Cardiovascular: Positive for leg swelling. Negative for chest pain and palpitations.  Musculoskeletal: Positive for arthralgias.  Skin: Positive for color change and rash. Negative for pallor and wound.  Neurological: Negative for dizziness, light-headedness and headaches.  Psychiatric/Behavioral: Negative for agitation, self-injury, sleep disturbance and suicidal ideas. The patient is not nervous/anxious.     Social History   Tobacco Use  . Smoking status: Former Smoker    Packs/day:  1.00    Years: 45.00    Pack years: 45.00    Types: Cigarettes    Start date: 03/20/1973    Quit date: 06/26/2017    Years since quitting: 1.4  . Smokeless tobacco: Never Used  . Tobacco comment: started smoking as teenager ut 1 ppd until 63yo  Substance Use Topics  . Alcohol use: No    Alcohol/week: 0.0 standard drinks      Objective:   BP 132/80   Pulse 84   Temp 98.6 F (37 C)   Resp 16   Ht 5\' 7"  (1.702 m)   Wt 225 lb (102.1 kg)   SpO2 98%    BMI 35.24 kg/m  Vitals:   12/06/18 0819  BP: 132/80  Pulse: 84  Resp: 16  Temp: 98.6 F (37 C)  SpO2: 98%  Weight: 225 lb (102.1 kg)  Height: 5\' 7"  (1.702 m)  Body mass index is 35.24 kg/m.   Physical Exam  Patches of dull red erythema both anterior legs  General Appearance:    Alert, cooperative, no distress  Eyes:    PERRL, conjunctiva/corneas clear, EOM's intact       Lungs:     Clear to auscultation bilaterally, respirations unlabored  Heart:    Normal heart rate. Normal rhythm. No murmurs, rubs, or gallops.   MS:   All extremities are intact.   Neurologic:   Awake, alert, oriented x 3. No apparent focal neurological           defect.           Assessment & Plan    1. Rash No improvement with antifungal medications. Did improve, but never resolved with doxycycline. Will try - sulfamethoxazole-trimethoprim (BACTRIM DS) 800-160 MG tablet; Take 1 tablet by mouth 2 (two) times daily for 10 days.  Dispense: 20 tablet; Refill: 0  2. Essential hypertension Well controlled.  Continue current medications.    Recommended referral to dermatologist, but he does not have any insurance at this time. He is to call back if not improving on antibiotic or if sx worsen again after finishing antibiotic.      Lelon Huh, MD  [facility]

## 2019-07-21 ENCOUNTER — Other Ambulatory Visit: Payer: Self-pay | Admitting: Family Medicine

## 2019-08-14 ENCOUNTER — Other Ambulatory Visit: Payer: Self-pay | Admitting: Family Medicine

## 2019-08-14 NOTE — Telephone Encounter (Signed)
Requested medication (s) are due for refill today: no  Requested medication (s) are on the active medication list: yes  Last refill:  07/21/2019  Future visit scheduled: no  Notes to clinic: Patient has been given courtesy refill and has not followed up   Requested Prescriptions  Pending Prescriptions Disp Refills   lovastatin (MEVACOR) 40 MG tablet [Pharmacy Med Name: LOVASTATIN 40 MG TABLET] 30 tablet 0    Sig: TAKE 1 TABLET BY MOUTH EVERY DAY      Cardiovascular:  Antilipid - Statins Failed - 08/14/2019 11:34 AM      Failed - Total Cholesterol in normal range and within 360 days    Cholesterol, Total  Date Value Ref Range Status  08/21/2017 174 100 - 199 mg/dL Final          Failed - LDL in normal range and within 360 days    LDL Calculated  Date Value Ref Range Status  08/21/2017 97 0 - 99 mg/dL Final          Failed - HDL in normal range and within 360 days    HDL  Date Value Ref Range Status  08/21/2017 64 >39 mg/dL Final          Failed - Triglycerides in normal range and within 360 days    Triglycerides  Date Value Ref Range Status  08/21/2017 65 0 - 149 mg/dL Final          Passed - Patient is not pregnant      Passed - Valid encounter within last 12 months    Recent Outpatient Visits           8 months ago Rash   [facility] Birdie Sons, MD   1 year ago Cellulitis of left lower extremity   [facility] Birdie Sons, MD   1 year ago Cellulitis of left lower extremity   [facility] Birdie Sons, MD   1 year ago Shortness of breath   [facility] Birdie Sons, MD   1 year ago Shortness of breath   [facility] Birdie Sons, MD

## 2019-10-17 ENCOUNTER — Other Ambulatory Visit: Payer: Self-pay | Admitting: Family Medicine

## 2019-10-17 NOTE — Telephone Encounter (Signed)
Requested  medications are  due for refill today yes  Requested medications are on the active medication list yes  Last refill 5/4  Last visit 11/2018  Future visit scheduled NO  Notes to clinic Failed protocol due to no visit within 6 months.

## 2019-11-02 IMAGING — MR MR LUMBAR SPINE W/O CM
5 series · 34 of 48 positions shown · non-contrast
Comparison: None.

CLINICAL DATA: Lifting injury 6006.  Low back pain for 4-5 years

EXAM:
MRI LUMBAR SPINE WITHOUT CONTRAST
TECHNIQUE: Multiplanar, multisequence MR imaging of the lumbar spine was
performed. No intravenous contrast was administered.

[Series 2: T2 · sagittal · 4.0mm · 0.81mm/px · 6 of 16 slices shown (1 of 2)]
[im 1/16]
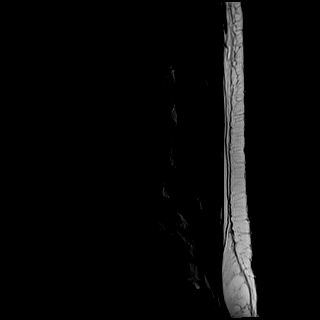
[im 4/16]
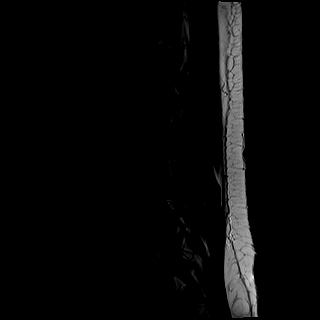
[im 7/16]
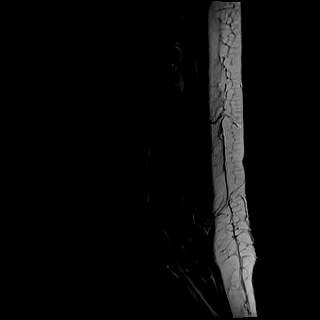
[im 10/16]
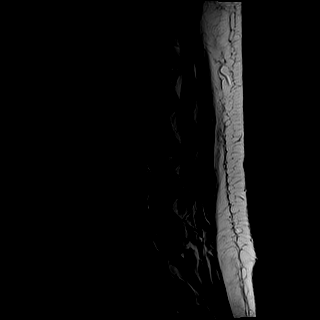
[im 13/16]
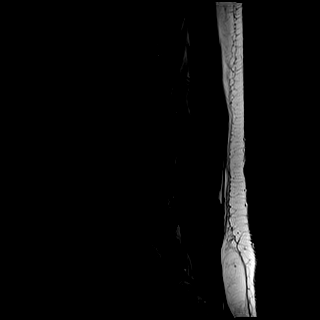
[im 16/16]
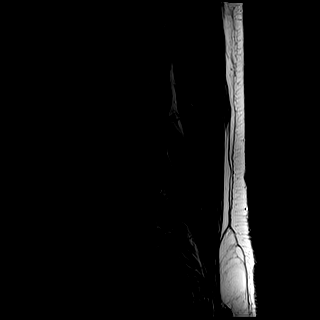

[Series 3: T1 · sagittal · 4.0mm · 0.81mm/px · 6 of 16 slices shown (1 of 2)]
[im 1/16]
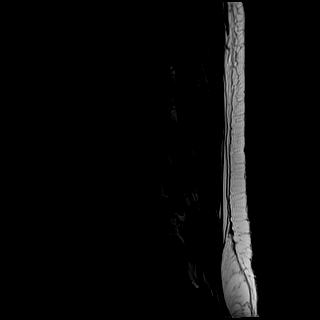
[im 4/16]
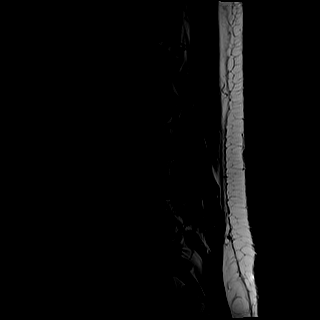
[im 7/16]
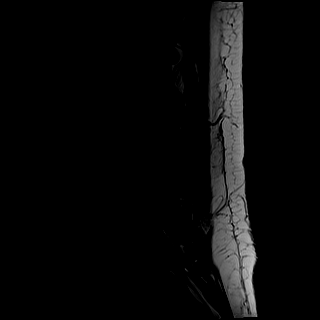
[im 10/16]
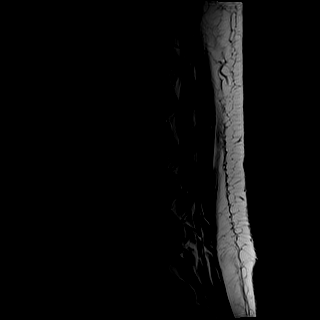
[im 13/16]
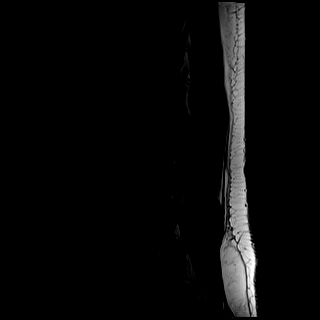
[im 16/16]
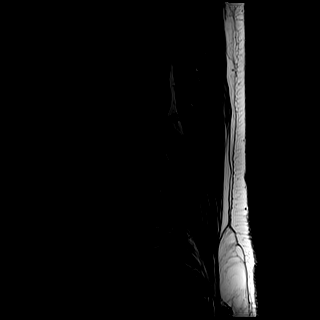

[Series 4: STIR · sagittal · 4.0mm · 0.81mm/px · 4 of 16 slices shown]
[im 1/16]
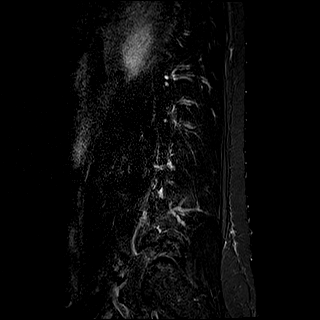
[im 4/16]
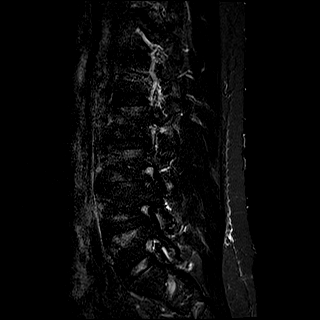
[im 7/16]
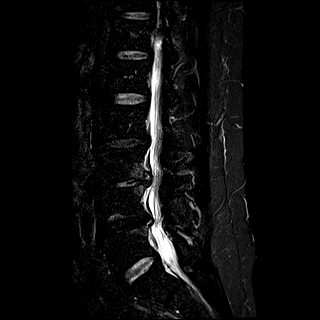
[im 10/16]
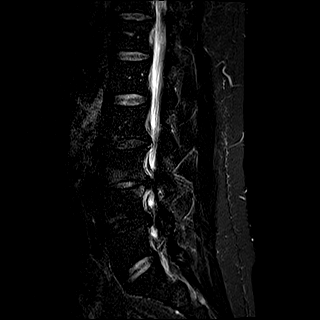

[Series 5: T2 · axial · 4.0mm · 0.78mm/px · z∈[-40,+166]mm · 9 of 40 slices shown (2 of 2)]
[im 1/40]
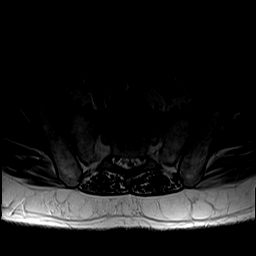
[im 6/40]
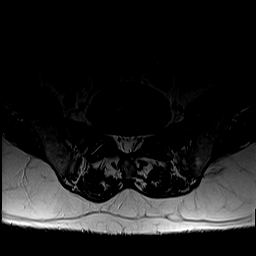
[im 12/40]
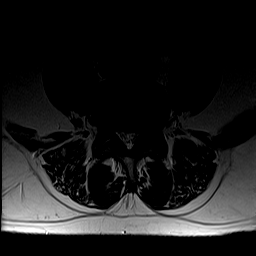
[im 17/40]
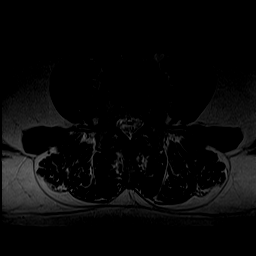
[im 20/40]
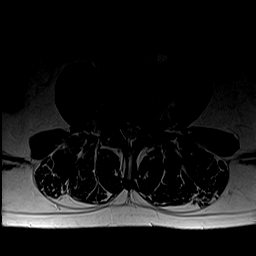
[im 23/40]
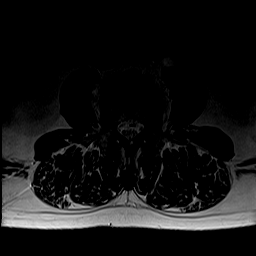
[im 28/40]
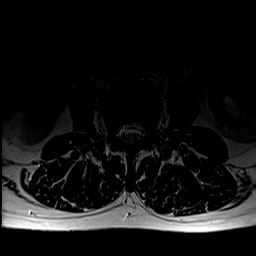
[im 34/40]
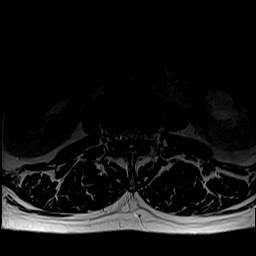
[im 40/40]
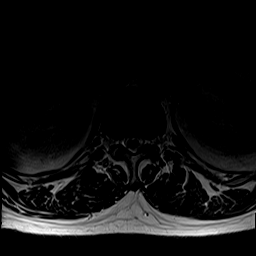

[Series 6: T1 · axial · 4.0mm · 0.39mm/px · z∈[-40,+166]mm · 9 of 40 slices shown (2 of 2)]
[im 1/40]
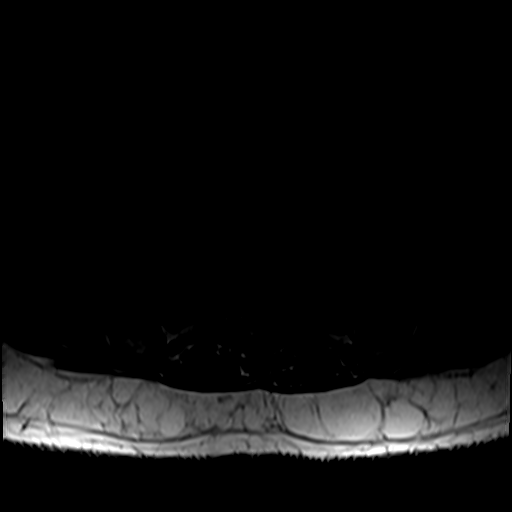
[im 6/40]
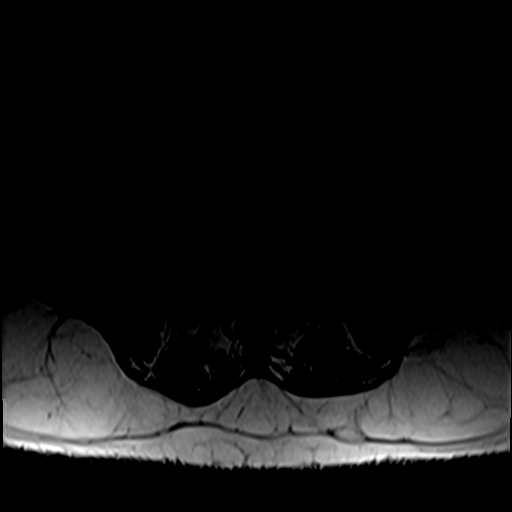
[im 12/40]
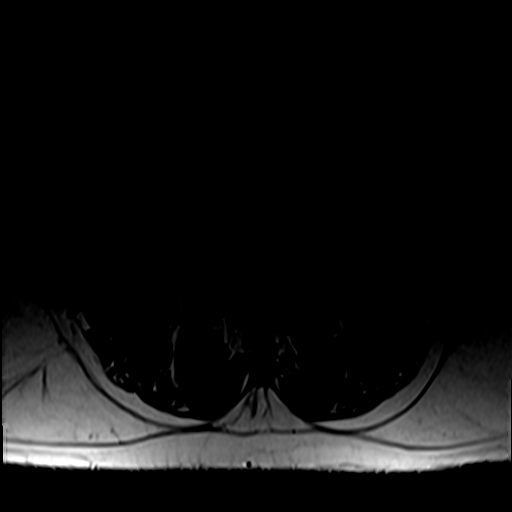
[im 17/40]
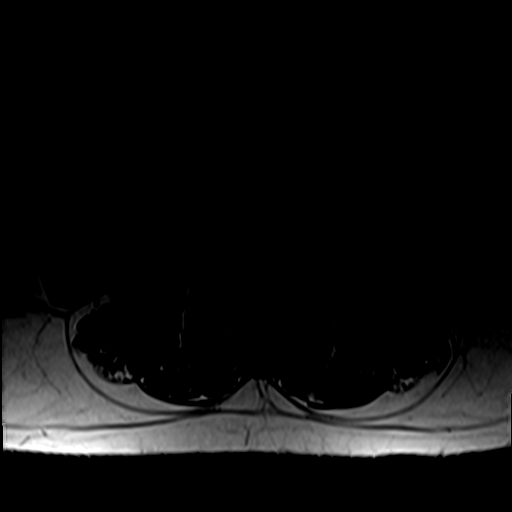
[im 20/40]
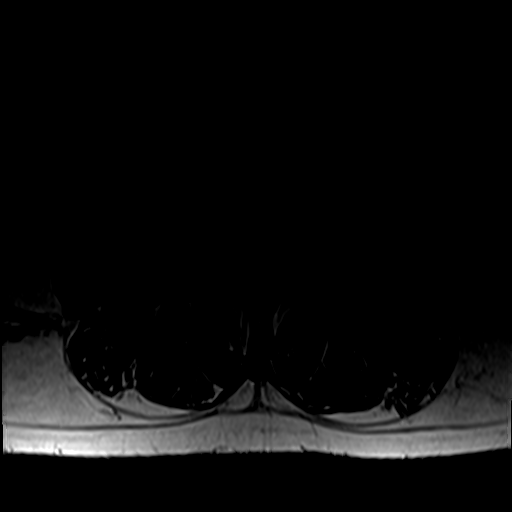
[im 23/40]
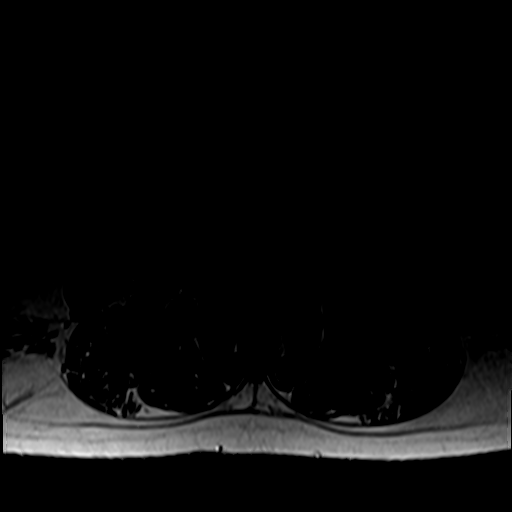
[im 28/40]
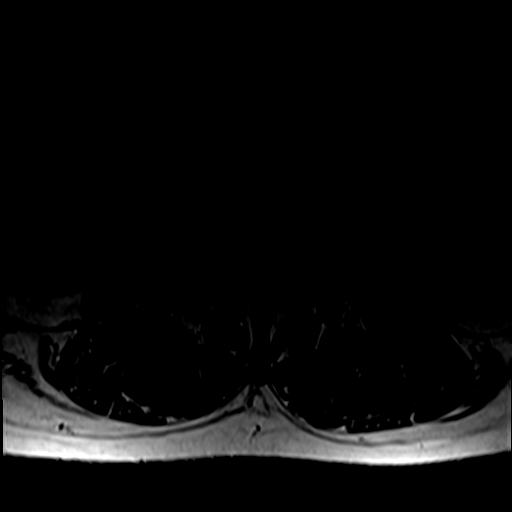
[im 34/40]
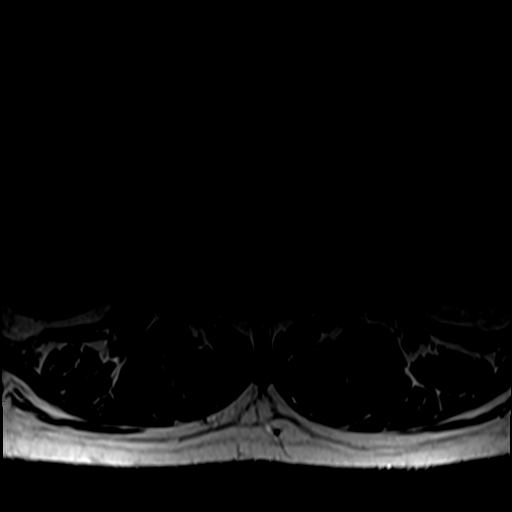
[im 40/40]
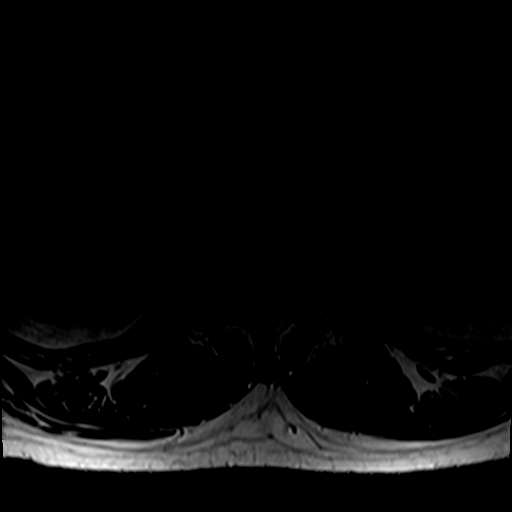

[34 of 48 positions shown; findings below may reference images not displayed]

FINDINGS: Segmentation:  Standard.

Alignment:  Physiologic.

Vertebrae:  No fracture, evidence of discitis, or bone lesion.

Conus medullaris and cauda equina: Conus extends to the L1 level.
Conus and cauda equina appear normal.

Paraspinal and other soft tissues: No acute paraspinal abnormality.

Disc levels:

Disc spaces: Degenerative disc disease with disc height loss at
L3-4. Disc desiccation at L2-3 and L4-5.

T12-L1: No significant disc bulge. No evidence of neural foraminal
stenosis. No central canal stenosis.

L1-L2: No significant disc bulge. No evidence of neural foraminal
stenosis. No central canal stenosis. Mild bilateral facet
arthropathy.

L2-L3: Mild broad-based disc bulge flattening the ventral thecal
sac. Mild bilateral facet arthropathy. No evidence of neural
foraminal stenosis. No central canal stenosis.

L3-L4: Broad-based disc bulge flattening the ventral thecal sac.
Moderate bilateral facet arthropathy with ligamentum flavum
infolding. Moderate-severe spinal stenosis. Mild bilateral foraminal
stenosis.

L4-L5: Broad-based disc bulge with a central annular fissure.
Moderate bilateral facet arthropathy. Moderate spinal stenosis. Mild
bilateral foraminal stenosis. Bilateral lateral recess stenosis.

L5-S1: Mild broad-based disc bulge. Moderate right and mild left
facet arthropathy. No evidence of neural foraminal stenosis. No
central canal stenosis.
IMPRESSION: 1. At L3-4 there is a disc bulge flattening the ventral thecal sac.
Moderate bilateral facet arthropathy with ligamentum flavum
infolding. Moderate-severe spinal stenosis. Mild bilateral foraminal
stenosis.
2. At L4-5 there is a disc bulge with a central annular fissure.
Moderate bilateral facet arthropathy. Moderate spinal stenosis. Mild
bilateral foraminal stenosis. Bilateral lateral recess stenosis.

## 2020-10-05 ENCOUNTER — Other Ambulatory Visit: Payer: Self-pay

## 2020-10-05 ENCOUNTER — Other Ambulatory Visit
Admission: RE | Admit: 2020-10-05 | Discharge: 2020-10-05 | Disposition: A | Discharge status: Home / Self Care | Payer: Medicare Other | Source: Ambulatory Visit | Attending: Surgery | Admitting: Surgery

## 2020-10-05 ENCOUNTER — Ambulatory Visit: Payer: Self-pay | Admitting: Surgery

## 2020-10-05 HISTORY — DX: Type 2 diabetes mellitus without complications: E11.9

## 2020-10-05 HISTORY — DX: Angina pectoris, unspecified: I20.9

## 2020-10-05 HISTORY — DX: Personal history of urinary calculi: Z87.442

## 2020-10-05 HISTORY — DX: Chronic obstructive pulmonary disease, unspecified: J44.9

## 2020-10-05 HISTORY — DX: Unspecified osteoarthritis, unspecified site: M19.90

## 2020-10-05 MED ORDER — FAMOTIDINE 20 MG PO TABS: 20.0000 mg | ORAL_TABLET | Freq: Once | ORAL | Status: AC

## 2020-10-05 MED ORDER — ACETAMINOPHEN 500 MG PO TABS: 1000.0000 mg | ORAL_TABLET | ORAL | Status: AC

## 2020-10-05 MED ORDER — CELECOXIB 200 MG PO CAPS: 200.0000 mg | ORAL_CAPSULE | ORAL | Status: AC

## 2020-10-05 MED ORDER — SODIUM CHLORIDE 0.9 % IV SOLN: INTRAVENOUS | Status: DC

## 2020-10-05 MED ORDER — GABAPENTIN 300 MG PO CAPS: 300.0000 mg | ORAL_CAPSULE | ORAL | Status: AC

## 2020-10-05 MED ORDER — ORAL CARE MOUTH RINSE: 15.0000 mL | Freq: Once | OROMUCOSAL | Status: AC

## 2020-10-05 MED ORDER — CHLORHEXIDINE GLUCONATE 0.12 % MT SOLN: 15.0000 mL | Freq: Once | OROMUCOSAL | Status: AC

## 2020-10-05 MED ORDER — CHLORHEXIDINE GLUCONATE CLOTH 2 % EX PADS: 6.0000 | MEDICATED_PAD | Freq: Once | CUTANEOUS | Status: DC

## 2020-10-05 NOTE — Patient Instructions (Addendum)
Your procedure is scheduled on: 10/06/20  Report to the Registration Desk on the 1st floor of the [facility]. To find out your arrival time, please call [phone] between 1PM - 3PM on: 10/05/20  REMEMBER: Instructions that are not followed completely may result in serious medical risk, up to and including death; or upon the discretion of your surgeon and anesthesiologist your surgery may need to be rescheduled.  Do not eat food after midnight the night before surgery.  No gum chewing, lozengers or hard candies.  You may however, drink CLEAR liquids up to 2 hours before you are scheduled to arrive for your surgery. Do not drink anything within 2 hours of your scheduled arrival time. Type 1 and Type 2 diabetics should only drink water.  TAKE THESE MEDICATIONS THE MORNING OF SURGERY WITH A SIP OF WATER:  - Tiotropium Bromide Monohydrate (SPIRIVA RESPIMAT) 2.5 MCG/ACT AERS  Follow recommendations from Cardiologist, Pulmonologist or PCP regarding stopping Aspirin, Coumadin, Plavix, Eliquis, Pradaxa, or Pletal.  Aspirin 81 mg stopped on 09/28/20.  Stop taking Metformin 500 mg 1 tablet daily starting 10/05/20 , may resume the day after surgery.  One week prior to surgery: Stop Anti-inflammatories (NSAIDS) such as Advil, Aleve, Ibuprofen, Motrin, Naproxen, Naprosyn and Aspirin based products such as Excedrin, Goodys Powder, BC Powder.  Stop ANY OVER THE COUNTER supplements until after surgery.  You may however, continue to take Tylenol if needed for pain up until the day of surgery.  No Alcohol for 24 hours before or after surgery.  No Smoking including e-cigarettes for 24 hours prior to surgery.  No chewable tobacco products for at least 6 hours prior to surgery.  No nicotine patches on the day of surgery.  Do not use any "recreational" drugs for at least a week prior to your surgery.  Please be advised that the combination of cocaine and anesthesia may have negative outcomes, up to  and including death. If you test positive for cocaine, your surgery will be cancelled.  On the morning of surgery brush your teeth with toothpaste and water, you may rinse your mouth with mouthwash if you wish. Do not swallow any toothpaste or mouthwash.  Do not wear jewelry, make-up, hairpins, clips or nail polish.  Do not wear lotions, powders, or perfumes.   Do not shave body from the neck down 48 hours prior to surgery just in case you cut yourself which could leave a site for infection.  Also, freshly shaved skin may become irritated if using the CHG soap.  Contact lenses, hearing aids and dentures may not be worn into surgery.  Do not bring valuables to the hospital. [facility] is not responsible for any missing/lost belongings or valuables.   Notify your doctor if there is any change in your medical condition (cold, fever, infection).  Wear comfortable clothing (specific to your surgery type) to the hospital.  After surgery, you can help prevent lung complications by doing breathing exercises.  Take deep breaths and cough every 1-2 hours. Your doctor may order a device called an Incentive Spirometer to help you take deep breaths. When coughing or sneezing, hold a pillow firmly against your incision with both hands. This is called "splinting." Doing this helps protect your incision. It also decreases belly discomfort.  If you are being admitted to the hospital overnight, leave your suitcase in the car. After surgery it may be brought to your room.  If you are being discharged the day of surgery, you will not  be allowed to drive home. You will need a responsible adult (18 years or older) to drive you home and stay with you that night.   If you are taking public transportation, you will need to have a responsible adult (18 years or older) with you. Please confirm with your physician that it is acceptable to use public transportation.   Please call the [facility]  Dept. at [phone] if you have any questions about these instructions.  Surgery Visitation Policy:  Patients undergoing a surgery or procedure may have one family member or support person with them as long as that person is not COVID-19 positive or experiencing its symptoms.  That person may remain in the waiting area during the procedure.  Inpatient Visitation:    Visiting hours are 7 a.m. to 8 p.m. Inpatients will be allowed two visitors daily. The visitors may change each day during the patient's stay. No visitors under the age of 64. Any visitor under the age of 83 must be accompanied by an adult. The visitor must pass COVID-19 screenings, use hand sanitizer when entering and exiting the patient's room and wear a mask at all times, including in the patient's room. Patients must also wear a mask when staff or their visitor are in the room. Masking is required regardless of vaccination status.

## 2020-10-05 NOTE — H&P (View-Only) (Signed)
Subjective:  CC: Grade II hemorrhoids [K64.1]   HPI: Jesus Mack is a 65 y.o. male who returns for above. Bleeding mostly resolved, just uncomfortable when they protrude during BMs and stays out for about an hour. Ready to discuss hemorrhoidectomy again.  Toilet habits: Daily, soft BMs.   Past Medical History: has a past medical history of Arthritis (2016), COPD (chronic obstructive pulmonary disease) (CMS-HCC), Diabetes mellitus without complication (CMS-HCC) (81/12/3157), Hyperlipidemia, and Hypertension.  Past Surgical History: has a past surgical history that includes cyst removed from shoulder.  Family History: family history includes Cancer in his mother; Heart disease in his mother; High blood pressure (Hypertension) in his father; Hyperlipidemia (Elevated cholesterol) in his father; No Known Problems in his maternal grandfather, maternal grandmother, paternal grandfather, and paternal grandmother; Prostate cancer in his brother; Stroke in his sister.  Social History: reports that he quit smoking about 3 years ago. His smoking use included cigarettes. He started smoking about 48 years ago. He has a 45.00 pack-year smoking history. He has never used smokeless tobacco. He reports previous alcohol use. He reports that he does not use drugs.  Current Medications: has a current medication list which includes the following prescription(s): amlodipine, aspirin, lovastatin, metformin, multivitamin, nitroglycerin, triamcinolone, albuterol, and hydrochlorothiazide.  Allergies:  Allergies as of 09/06/2020   (No Known Allergies)   ROS:  A 15 point review of systems was performed and pertinent positives and negatives noted in HPI  Objective:    BP (!) 140/86  Pulse 75  Ht 167.6 cm (5\' 6" )  Wt (!) 102.5 kg (226 lb)  BMI 36.48 kg/m   Constitutional : alert, appears stated age, cooperative and no distress  Lymphatics/Throat:: no asymmetry, masses, or scars  Respiratory: clear to  auscultation bilaterally  Cardiovascular: regular rate and rhythm  Gastrointestinal: soft, non-tender; bowel sounds normal; no masses, no organomegaly.  Musculoskeletal: Steady gait and movement  Skin: Cool and moist  Psychiatric: Normal affect, non-agitated, not confused  Genital/Rectal: Deferred today   LABS:  n/a   RADS: n/a Assessment:    Grade II hemorrhoids [K64.1]  External skin tag Plan:   1. Grade II hemorrhoids [K64.1] Discussed risks/benefits/alternatives to surgery again. Alternatives include the options of observation, medical management. Benefits include symptomatic relief. I discussed in detail and the complications related to the operation and the anesthesia, including bleeding, infection, recurrence, remote possibility of temporary or permanent fecal incontinence, poor/delayed wound healing, chronic pain, and additional procedures to address said risks. The risks of general anesthetic, if used, includes MI, CVA, sudden death or even reaction to anesthetic medications also discussed.  We also discussed typical post operative recovery which includes weeks to potentially months of anal pain, drainage, occasional bleeding, and sense of fecal urgency.   ED return precautions given for sudden increase in pain, bleeding, with possible accompanying fever, nausea, and/or vomiting.  The patient understands the risks, any and all questions were answered to the patient's satisfaction.  2. Pt will like to look at costs of procedure again before committing. Date tentatively scheduled in the meantime

## 2020-10-05 NOTE — H&P (Signed)
Subjective:  CC: Grade II hemorrhoids [K64.1]   HPI: Jesus Mack is a 65 y.o. male who returns for above. Bleeding mostly resolved, just uncomfortable when they protrude during BMs and stays out for about an hour. Ready to discuss hemorrhoidectomy again.  Toilet habits: Daily, soft BMs.   Past Medical History: has a past medical history of Arthritis (2016), COPD (chronic obstructive pulmonary disease) (CMS-HCC), Diabetes mellitus without complication (CMS-HCC) (30/11/2328), Hyperlipidemia, and Hypertension.  Past Surgical History: has a past surgical history that includes cyst removed from shoulder.  Family History: family history includes Cancer in his mother; Heart disease in his mother; High blood pressure (Hypertension) in his father; Hyperlipidemia (Elevated cholesterol) in his father; No Known Problems in his maternal grandfather, maternal grandmother, paternal grandfather, and paternal grandmother; Prostate cancer in his brother; Stroke in his sister.  Social History: reports that he quit smoking about 3 years ago. His smoking use included cigarettes. He started smoking about 48 years ago. He has a 45.00 pack-year smoking history. He has never used smokeless tobacco. He reports previous alcohol use. He reports that he does not use drugs.  Current Medications: has a current medication list which includes the following prescription(s): amlodipine, aspirin, lovastatin, metformin, multivitamin, nitroglycerin, triamcinolone, albuterol, and hydrochlorothiazide.  Allergies:  Allergies as of 09/06/2020   (No Known Allergies)   ROS:  A 15 point review of systems was performed and pertinent positives and negatives noted in HPI  Objective:    BP (!) 140/86  Pulse 75  Ht 167.6 cm (5\' 6" )  Wt (!) 102.5 kg (226 lb)  BMI 36.48 kg/m   Constitutional : alert, appears stated age, cooperative and no distress  Lymphatics/Throat:: no asymmetry, masses, or scars  Respiratory: clear to  auscultation bilaterally  Cardiovascular: regular rate and rhythm  Gastrointestinal: soft, non-tender; bowel sounds normal; no masses, no organomegaly.  Musculoskeletal: Steady gait and movement  Skin: Cool and moist  Psychiatric: Normal affect, non-agitated, not confused  Genital/Rectal: Deferred today   LABS:  n/a   RADS: n/a Assessment:    Grade II hemorrhoids [K64.1]  External skin tag Plan:   1. Grade II hemorrhoids [K64.1] Discussed risks/benefits/alternatives to surgery again. Alternatives include the options of observation, medical management. Benefits include symptomatic relief. I discussed in detail and the complications related to the operation and the anesthesia, including bleeding, infection, recurrence, remote possibility of temporary or permanent fecal incontinence, poor/delayed wound healing, chronic pain, and additional procedures to address said risks. The risks of general anesthetic, if used, includes MI, CVA, sudden death or even reaction to anesthetic medications also discussed.  We also discussed typical post operative recovery which includes weeks to potentially months of anal pain, drainage, occasional bleeding, and sense of fecal urgency.   ED return precautions given for sudden increase in pain, bleeding, with possible accompanying fever, nausea, and/or vomiting.  The patient understands the risks, any and all questions were answered to the patient's satisfaction.  2. Pt will like to look at costs of procedure again before committing. Date tentatively scheduled in the meantime

## 2020-10-06 ENCOUNTER — Ambulatory Visit
Admission: RE | Admit: 2020-10-06 | Discharge: 2020-10-06 | Disposition: A | Discharge status: Home / Self Care | Payer: Medicare Other | Attending: Surgery | Admitting: Surgery

## 2020-10-06 ENCOUNTER — Ambulatory Visit: Payer: Medicare Other | Admitting: Anesthesiology

## 2020-10-06 ENCOUNTER — Encounter
Admission: RE | Disposition: A | Discharge status: Home / Self Care | Payer: Self-pay | Source: Home / Self Care | Attending: Surgery

## 2020-10-06 ENCOUNTER — Other Ambulatory Visit: Payer: Self-pay

## 2020-10-06 ENCOUNTER — Encounter: Payer: Self-pay | Admitting: Surgery

## 2020-10-06 DIAGNOSIS — Z7982 Long term (current) use of aspirin: Secondary | ICD-10-CM | POA: Diagnosis not present

## 2020-10-06 DIAGNOSIS — Z87891 Personal history of nicotine dependence: Secondary | ICD-10-CM | POA: Diagnosis not present

## 2020-10-06 DIAGNOSIS — K642 Third degree hemorrhoids: Secondary | ICD-10-CM | POA: Diagnosis not present

## 2020-10-06 DIAGNOSIS — K641 Second degree hemorrhoids: Secondary | ICD-10-CM

## 2020-10-06 DIAGNOSIS — Z79899 Other long term (current) drug therapy: Secondary | ICD-10-CM | POA: Diagnosis not present

## 2020-10-06 DIAGNOSIS — Z7984 Long term (current) use of oral hypoglycemic drugs: Secondary | ICD-10-CM | POA: Diagnosis not present

## 2020-10-06 HISTORY — PX: EVALUATION UNDER ANESTHESIA WITH HEMORRHOIDECTOMY: SHX5624

## 2020-10-06 LAB — POCT I-STAT, CHEM 8
BUN: 17 mg/dL (ref 8–23)
Calcium, Ion: 1.27 mmol/L (ref 1.15–1.40)
Chloride: 104 mmol/L (ref 98–111)
Creatinine, Ser: 1.2 mg/dL (ref 0.61–1.24)
Glucose, Bld: 90 mg/dL (ref 70–99)
HCT: 45 % (ref 39.0–52.0)
Hemoglobin: 15.3 g/dL (ref 13.0–17.0)
Potassium: 4.4 mmol/L (ref 3.5–5.1)
Sodium: 139 mmol/L (ref 135–145)
TCO2: 26 mmol/L (ref 22–32)

## 2020-10-06 LAB — GLUCOSE, CAPILLARY: Glucose-Capillary: 90 mg/dL (ref 70–99)

## 2020-10-06 SURGERY — EXAM UNDER ANESTHESIA WITH HEMORRHOIDECTOMY
Anesthesia: General | Site: Rectum

## 2020-10-06 MED ORDER — FENTANYL CITRATE (PF) 100 MCG/2ML IJ SOLN: 25.0000 ug | INTRAMUSCULAR | Status: DC | PRN

## 2020-10-06 MED ORDER — IBUPROFEN 800 MG PO TABS: 800.0000 mg | ORAL_TABLET | Freq: Three times a day (TID) | ORAL | 0 refills | Status: AC | PRN

## 2020-10-06 MED ORDER — BUPIVACAINE LIPOSOME 1.3 % IJ SUSP
INTRAMUSCULAR | Status: AC
  Filled 2020-10-06: qty 20

## 2020-10-06 MED ORDER — BUPIVACAINE-EPINEPHRINE (PF) 0.5% -1:200000 IJ SOLN
INTRAMUSCULAR | Status: DC | PRN
  Administered 2020-10-06: 17 mL

## 2020-10-06 MED ORDER — PROPOFOL 10 MG/ML IV BOLUS: INTRAVENOUS | Status: DC | PRN

## 2020-10-06 MED ORDER — ACETAMINOPHEN 325 MG PO TABS: 650.0000 mg | ORAL_TABLET | Freq: Three times a day (TID) | ORAL | 0 refills | Status: AC | PRN

## 2020-10-06 MED ORDER — FAMOTIDINE 20 MG PO TABS
ORAL_TABLET | ORAL | Status: AC
  Administered 2020-10-06: 20 mg via ORAL
  Filled 2020-10-06: qty 1

## 2020-10-06 MED ORDER — MIDAZOLAM HCL 2 MG/2ML IJ SOLN
INTRAMUSCULAR | Status: AC
  Filled 2020-10-06: qty 2

## 2020-10-06 MED ORDER — HEMOSTATIC AGENTS (NO CHARGE) OPTIME
TOPICAL | Status: DC | PRN
  Administered 2020-10-06: 1 via TOPICAL

## 2020-10-06 MED ORDER — FENTANYL CITRATE (PF) 100 MCG/2ML IJ SOLN
INTRAMUSCULAR | Status: DC | PRN
  Administered 2020-10-06 (×4): 25 ug via INTRAVENOUS

## 2020-10-06 MED ORDER — PHENYLEPHRINE HCL (PRESSORS) 10 MG/ML IV SOLN
INTRAVENOUS | Status: DC | PRN
  Administered 2020-10-06 (×6): 100 ug via INTRAVENOUS

## 2020-10-06 MED ORDER — 0.9 % SODIUM CHLORIDE (POUR BTL) OPTIME
TOPICAL | Status: DC | PRN
  Administered 2020-10-06: 500 mL

## 2020-10-06 MED ORDER — ACETAMINOPHEN 500 MG PO TABS
ORAL_TABLET | ORAL | Status: AC
  Administered 2020-10-06: 1000 mg via ORAL
  Filled 2020-10-06: qty 2

## 2020-10-06 MED ORDER — ONDANSETRON HCL 4 MG/2ML IJ SOLN: 4.0000 mg | Freq: Once | INTRAMUSCULAR | Status: DC | PRN

## 2020-10-06 MED ORDER — DEXAMETHASONE SODIUM PHOSPHATE 10 MG/ML IJ SOLN
INTRAMUSCULAR | Status: DC | PRN
Start: 2020-10-06 — End: 2020-10-06
  Administered 2020-10-06: 10 mg via INTRAVENOUS

## 2020-10-06 MED ORDER — BUPIVACAINE LIPOSOME 1.3 % IJ SUSP
INTRAMUSCULAR | Status: DC | PRN
  Administered 2020-10-06: 20 mL

## 2020-10-06 MED ORDER — PROPOFOL 10 MG/ML IV BOLUS
INTRAVENOUS | Status: DC | PRN
  Administered 2020-10-06: 160 mg via INTRAVENOUS

## 2020-10-06 MED ORDER — PROPOFOL 10 MG/ML IV BOLUS
INTRAVENOUS | Status: AC
  Filled 2020-10-06: qty 20

## 2020-10-06 MED ORDER — HYDROCODONE-ACETAMINOPHEN 5-325 MG PO TABS: 1.0000 | ORAL_TABLET | Freq: Four times a day (QID) | ORAL | 0 refills | Status: AC | PRN

## 2020-10-06 MED ORDER — ONDANSETRON HCL 4 MG/2ML IJ SOLN
INTRAMUSCULAR | Status: DC | PRN
  Administered 2020-10-06: 4 mg via INTRAVENOUS

## 2020-10-06 MED ORDER — LIDOCAINE HCL (CARDIAC) PF 100 MG/5ML IV SOSY
PREFILLED_SYRINGE | INTRAVENOUS | Status: DC | PRN
  Administered 2020-10-06: 100 mg via INTRAVENOUS

## 2020-10-06 MED ORDER — DOCUSATE SODIUM 100 MG PO CAPS: 100.0000 mg | ORAL_CAPSULE | Freq: Two times a day (BID) | ORAL | 0 refills | Status: AC | PRN

## 2020-10-06 MED ORDER — MIDAZOLAM HCL 2 MG/2ML IJ SOLN
INTRAMUSCULAR | Status: DC | PRN
  Administered 2020-10-06: 2 mg via INTRAVENOUS

## 2020-10-06 MED ORDER — BUPIVACAINE-EPINEPHRINE (PF) 0.5% -1:200000 IJ SOLN
INTRAMUSCULAR | Status: AC
  Filled 2020-10-06: qty 30

## 2020-10-06 MED ORDER — MEPERIDINE HCL 25 MG/ML IJ SOLN: 6.2500 mg | INTRAMUSCULAR | Status: DC | PRN

## 2020-10-06 MED ORDER — FENTANYL CITRATE (PF) 100 MCG/2ML IJ SOLN
INTRAMUSCULAR | Status: AC
  Filled 2020-10-06: qty 2

## 2020-10-06 MED ORDER — GELATIN ABSORBABLE 12-7 MM EX MISC
CUTANEOUS | Status: AC
  Filled 2020-10-06: qty 1

## 2020-10-06 MED ORDER — CHLORHEXIDINE GLUCONATE 0.12 % MT SOLN
OROMUCOSAL | Status: AC
  Administered 2020-10-06: 15 mL via OROMUCOSAL
  Filled 2020-10-06: qty 15

## 2020-10-06 MED ORDER — GABAPENTIN 300 MG PO CAPS
ORAL_CAPSULE | ORAL | Status: AC
  Administered 2020-10-06: 300 mg via ORAL
  Filled 2020-10-06: qty 1

## 2020-10-06 MED ORDER — CELECOXIB 200 MG PO CAPS
ORAL_CAPSULE | ORAL | Status: AC
  Administered 2020-10-06: 200 mg via ORAL
  Filled 2020-10-06: qty 1

## 2020-10-06 SURGICAL SUPPLY — 31 items
BLADE SURG 15 STRL LF DISP TIS (BLADE) ×1 IMPLANT
BLADE SURG 15 STRL SS (BLADE) ×2
BRIEF STRETCH FOR OB PAD XXL (UNDERPADS AND DIAPERS) ×2 IMPLANT
CANISTER SUCT 1200ML W/VALVE (MISCELLANEOUS) ×2 IMPLANT
DRAPE PERI LITHO V/GYN (MISCELLANEOUS) ×2 IMPLANT
DRAPE UNDER BUTTOCK W/FLU (DRAPES) ×2 IMPLANT
DRSG GAUZE FLUFF 36X18 (GAUZE/BANDAGES/DRESSINGS) ×2 IMPLANT
ELECT REM PT RETURN 9FT ADLT (ELECTROSURGICAL) ×2
ELECTRODE REM PT RTRN 9FT ADLT (ELECTROSURGICAL) ×1 IMPLANT
GAUZE 4X4 16PLY ~~LOC~~+RFID DBL (SPONGE) ×2 IMPLANT
GLOVE SURG SYN 6.5 ES PF (GLOVE) ×2 IMPLANT
GLOVE SURG UNDER POLY LF SZ7 (GLOVE) ×2 IMPLANT
GOWN STRL REUS W/ TWL LRG LVL3 (GOWN DISPOSABLE) ×2 IMPLANT
GOWN STRL REUS W/TWL LRG LVL3 (GOWN DISPOSABLE) ×4
KIT TURNOVER CYSTO (KITS) ×2 IMPLANT
LABEL OR SOLS (LABEL) ×2 IMPLANT
MANIFOLD NEPTUNE II (INSTRUMENTS) ×2 IMPLANT
NEEDLE HYPO 22GX1.5 SAFETY (NEEDLE) ×2 IMPLANT
NEEDLE HYPO 25X1 1.5 SAFETY (NEEDLE) ×2 IMPLANT
NS IRRIG 500ML POUR BTL (IV SOLUTION) ×2 IMPLANT
PACK BASIN MINOR ARMC (MISCELLANEOUS) ×2 IMPLANT
PAD PREP 24X41 OB/GYN DISP (PERSONAL CARE ITEMS) ×2 IMPLANT
SHEARS HARMONIC 9CM CVD (BLADE) ×2 IMPLANT
SOL PREP PVP 2OZ (MISCELLANEOUS) ×2
SOLUTION PREP PVP 2OZ (MISCELLANEOUS) ×1 IMPLANT
SPONGE SURGIFOAM ABS GEL 12-7 (HEMOSTASIS) ×2 IMPLANT
SURGILUBE 2OZ TUBE FLIPTOP (MISCELLANEOUS) ×2 IMPLANT
SUT VIC AB 3-0 SH 27 (SUTURE) ×4
SUT VIC AB 3-0 SH 27X BRD (SUTURE) ×2 IMPLANT
SYR 10ML LL (SYRINGE) ×2 IMPLANT
SYR 20ML LL LF (SYRINGE) ×2 IMPLANT

## 2020-10-06 NOTE — Discharge Instructions (Addendum)
Hemorrhoids, Care After This sheet gives you information about how to care for yourself after your procedure. Your health care provider may also give you more specific instructions. If you have problems or questions, contact your health care provider. What can I expect after the procedure? After the procedure, it is common to have: Soreness. Bruising. Itching. Follow these instructions at home: site care Follow instructions from your health care provider about how to take care of your site. Make sure you: LEAVE packing in place until it falls out on its own.  No need to replace afterwards Leave stitches (sutures), skin glue, or adhesive strips in place.  If the area bleeds or bruises, apply gentle pressure for 10 minutes. OK TO SHOWER IN 24HRS  General instructions Rest and then return to your normal activities as told by your health care provider. RESUME ASPIRIN IN 48HRS  tylenol and advil as needed for discomfort.  Please alternate between the two every four hours as needed for pain.    Use narcotics, if prescribed, only when tylenol and motrin is not enough to control pain.  325-650mg  every 8hrs to max of 3000mg /24hrs (including the 325mg  in every norco dose) for the tylenol.    Advil up to 800mg  per dose every 8hrs as needed for pain.   Keep all follow-up visits as told by your health care provider. This is important. Contact a health care provider if you have excessive: redness, swelling, or pain around your site. blood coming from your site. pus or a bad smell coming from your site. You have a fever.  Get help right away if: You have bleeding that does not stop with pressure or a dressing. Summary After the procedure, it is common to have some soreness, bruising, and itching at the site. Follow instructions from your health care provider about how to take care of your site. Check your site every day for signs of infection. Contact a health care provider if you have redness,  swelling, or pain around your site, or your site feels warm to the touch. Keep all follow-up visits as told by your health care provider. This is important. This information is not intended to replace advice given to you by your health care provider. Make sure you discuss any questions you have with your health care provider. Document Released: 04/02/2015 Document Revised: 09/03/2017 Document Reviewed: 09/03/2017 Elsevier Interactive Patient Education  2019 [employer]   The drugs that you were given will stay in your system until tomorrow so for the next 24 hours you should not:  Drive an automobile Make any legal decisions Drink any alcoholic beverage   You may resume regular meals tomorrow.  Today it is better to start with liquids and gradually work up to solid foods.  You may eat anything you prefer, but it is better to start with liquids, then soup and crackers, and gradually work up to solid foods.   Please notify your doctor immediately if you have any unusual bleeding, trouble breathing, redness and pain at the surgery site, drainage, fever, or pain not relieved by medication.    Additional Instructions:  Please contact your physician with any problems or Same Day Surgery at [phone], Monday through Friday 6 am to 4 pm, or [place] at [facility] number at [phone].

## 2020-10-06 NOTE — Op Note (Signed)
Preoperative diagnosis: third degree hemorrhoids.   Postoperative diagnosis: third  degree hemorrhoids.  Procedure: exam under anesthesia, two column hemorrhoidectomy.  Surgeon: Lysle Pearl  Anesthesia: general  Specimen: hemorrhoids x2  Complications: none  EBL: 77mL  Wound classification: Clean Contaminated  Indications: Patient is a 65 y.o. male was found to have symptomatic hemorrhoids refractory to medical management.   Findings: 1. third  degree hemorrhoids 2. Internal and external anal sphincter palpated and preserved 3. Adequate hemostasis  Description of procedure: The patient was brought to the operating room and general anesthesia was induced. Patient was placed high lithotomy position. A time-out was completed verifying correct patient, procedure, site, positioning, and implant(s) and/or special equipment prior to beginning this procedure. The perineum was prepped and draped in standard sterile fashion. Local anesthetic was injected as a perianal block. An anoscope was introduced and hemorrhoidal pedicles identified.  A harmonic was placed across the base of the left lateral pedicle and excess hemorrhoidal tissue removed.  Specimen was passed off operative field pending pathology.  3-0 Vicryl then used to close the open wound.  A harmonic was placed across the base of the right anterior pedicle and excess hemorrhoidal tissue removed.  Specimen was passed off operative field pending pathology.  3-0 Vicryl then used to close the open wound.  Hemostasis achieved with additional 3-0 vicryl suture in areas of bleeding until all active bleeding controlled.  Last inspection of the anal canal did not note any additional hemorrhoidal tissue and no other pathology. Exparel injected as a perianal block. A gauze pad was tucked between the gluteal folds, and secured in place with mesh underwear.  The patient tolerated the procedure well and was taken to the postanesthesia care unit in  stable condition.  Sponge and instrument count correct at end of procedure.

## 2020-10-06 NOTE — Transfer of Care (Signed)
Immediate Anesthesia Transfer of Care Note  Patient: Jesus Mack  Procedure(s) Performed: Jasmine December UNDER ANESTHESIA WITH HEMORRHOIDECTOMY (Rectum)  Patient Location: PACU  Anesthesia Type:General  Level of Consciousness: drowsy  Airway & Oxygen Therapy: Patient Spontanous Breathing and Patient connected to face mask oxygen  Post-op Assessment: Report given to RN and Post -op Vital signs reviewed and stable  Post vital signs: Reviewed and stable  Last Vitals:  Vitals Value Taken Time  BP 119/76 10/06/20 1341  Temp    Pulse 89 10/06/20 1342  Resp 11 10/06/20 1342  SpO2 100 % 10/06/20 1342  Vitals shown include unvalidated device data.  Last Pain:  Vitals:   10/06/20 1236  TempSrc: Oral  PainSc: 5          Complications: No notable events documented.

## 2020-10-06 NOTE — Anesthesia Procedure Notes (Signed)
Procedure Name: LMA Insertion Date/Time: 10/06/2020 12:58 PM Performed by: Lily Peer, [employer], CRNA Pre-anesthesia Checklist: Patient identified, Emergency Drugs available, Suction available and Patient being monitored Patient Re-evaluated:Patient Re-evaluated prior to induction Oxygen Delivery Method: Circle system utilized Preoxygenation: Pre-oxygenation with 100% oxygen Induction Type: IV induction Ventilation: Mask ventilation without difficulty LMA: LMA inserted LMA Size: 4.0 Number of attempts: 1 Placement Confirmation: positive ETCO2 and breath sounds checked- equal and bilateral Tube secured with: Tape Dental Injury: Teeth and Oropharynx as per pre-operative assessment

## 2020-10-06 NOTE — Anesthesia Preprocedure Evaluation (Addendum)
Anesthesia Evaluation  Patient identified by MRN, date of birth, ID band Patient awake    Reviewed: Allergy & Precautions, NPO status , Patient's Chart, lab work & pertinent test results  History of Anesthesia Complications Negative for: history of anesthetic complications  Airway Mallampati: III  TM Distance: >3 FB Neck ROM: full    Dental  (+) Poor Dentition, Missing   Pulmonary neg pulmonary ROS, neg sleep apnea, neg COPD, former smoker,    Pulmonary exam normal        Cardiovascular hypertension, Pt. on medications + angina with exertion (-) Past MI and (-) CHF Normal cardiovascular exam(-) dysrhythmias (-) Valvular Problems/Murmurs  MPS 06/2020: LVEF= 58 %   FINDINGS:  Regional wall motion: reveals normal myocardial thickening and wall  motion.  The overall quality of the study is good.   Artifacts noted: no  Left ventricular cavity: normal.    Neuro/Psych neg Seizures negative neurological ROS  negative psych ROS   GI/Hepatic negative GI ROS, Neg liver ROS, GERD (in the past)  ,  Endo/Other  negative endocrine ROSdiabetes, Type 2  Renal/GU negative Renal ROS  negative genitourinary   Musculoskeletal  (+) Arthritis , Osteoarthritis,    Abdominal   Peds  Hematology negative hematology ROS (+)   Anesthesia Other Findings Anginal pain (HCC)    Arthritis    COPD (chronic obstructive pulmonary disease) (HCC)    Diabetes mellitus without complication ([place])    History of kidney stones   Hypertension       Reproductive/Obstetrics negative OB ROS                                                           Anesthesia Evaluation  Patient identified by MRN, date of birth, ID band Patient awake    Reviewed: Allergy & Precautions, NPO status , Patient's Chart, lab work & pertinent test results  History of Anesthesia Complications Negative for: history of anesthetic  complications  Airway Mallampati: III       Dental  (+) Missing, Chipped   Pulmonary neg sleep apnea, neg COPD, former smoker (quit x 3 weeks),           Cardiovascular hypertension, Pt. on medications (-) Past MI and (-) CHF (-) dysrhythmias (-) Valvular Problems/Murmurs     Neuro/Psych neg Seizures    GI/Hepatic Neg liver ROS, GERD (in the past)  ,  Endo/Other  neg diabetes  Renal/GU negative Renal ROS     Musculoskeletal   Abdominal   Peds  Hematology   Anesthesia Other Findings   Reproductive/Obstetrics                             Anesthesia Physical Anesthesia Plan  ASA: II  Anesthesia Plan: General   Post-op Pain Management:    Induction: Intravenous  PONV Risk Score and Plan: 2 and Propofol infusion and TIVA  Airway Management Planned: Nasal Cannula  Additional Equipment:   Intra-op Plan:   Post-operative Plan:   Informed Consent: I have reviewed the patients History and Physical, chart, labs and discussed the procedure including the risks, benefits and alternatives for the proposed anesthesia with the patient or authorized representative who has indicated his/her understanding and acceptance.     Plan Discussed with:  Anesthesia Plan Comments:         Anesthesia Quick Evaluation  Anesthesia Physical  Anesthesia Plan  ASA: 3  Anesthesia Plan: General   Post-op Pain Management:    Induction: Intravenous  PONV Risk Score and Plan: 2 and Propofol infusion and Midazolam  Airway Management Planned: LMA  Additional Equipment:   Intra-op Plan:   Post-operative Plan: Extubation in OR  Informed Consent: I have reviewed the patients History and Physical, chart, labs and discussed the procedure including the risks, benefits and alternatives for the proposed anesthesia with the patient or authorized representative who has indicated his/her understanding and acceptance.     Dental  Advisory Given  Plan Discussed with: Surgeon, Anesthesiologist and CRNA  Anesthesia Plan Comments: (Patient consented for risks of anesthesia including but not limited to:  - adverse reactions to medications - damage to eyes, teeth, lips or other oral mucosa - nerve damage due to positioning  - sore throat or hoarseness - Damage to heart, brain, nerves, lungs, other parts of body or loss of life  Patient voiced understanding.)     Anesthesia Quick Evaluation

## 2020-10-06 NOTE — Anesthesia Postprocedure Evaluation (Signed)
Anesthesia Post Note  Patient: Jesus Mack  Procedure(s) Performed: EXAM UNDER ANESTHESIA WITH HEMORRHOIDECTOMY (Rectum)  Patient location during evaluation: PACU Anesthesia Type: General Level of consciousness: awake and alert, awake and oriented Pain management: pain level controlled Vital Signs Assessment: post-procedure vital signs reviewed and stable Respiratory status: spontaneous breathing, nonlabored ventilation and respiratory function stable Cardiovascular status: blood pressure returned to baseline and stable Postop Assessment: no apparent nausea or vomiting Anesthetic complications: no   No notable events documented.   Last Vitals:  Vitals:   10/06/20 1415 10/06/20 1430  BP: 107/71 107/74  Pulse: 76 77  Resp: 15 15  Temp:    SpO2: 100% 100%    Last Pain:  Vitals:   10/06/20 1415  TempSrc:   PainSc: 1                  Phill Mutter

## 2020-10-07 ENCOUNTER — Encounter: Payer: Self-pay | Admitting: Surgery

## 2020-10-07 NOTE — Interval H&P Note (Signed)
History and Physical Interval Note:  10/07/2020 9:21 AM  Jesus Mack  has presented today for surgery, with the diagnosis of Grade II hemorrhoids K64.1.  The various methods of treatment have been discussed with the patient and family. After consideration of risks, benefits and other options for treatment, the patient has consented to  Procedure(s): EXAM UNDER ANESTHESIA WITH HEMORRHOIDECTOMY (N/A) as a surgical intervention.  The patient's history has been reviewed, patient examined, no change in status, stable for surgery.  I have reviewed the patient's chart and labs.  Questions were answered to the patient's satisfaction.     [name] Lysle Pearl

## 2020-10-08 LAB — SURGICAL PATHOLOGY

## 2023-06-21 ENCOUNTER — Other Ambulatory Visit: Payer: Self-pay | Admitting: Physician Assistant

## 2023-06-21 DIAGNOSIS — Z87891 Personal history of nicotine dependence: Secondary | ICD-10-CM

## 2023-06-26 ENCOUNTER — Ambulatory Visit
Admission: RE | Admit: 2023-06-26 | Discharge: 2023-06-26 | Disposition: A | Discharge status: Home / Self Care | Source: Ambulatory Visit | Attending: Physician Assistant | Admitting: Physician Assistant

## 2023-06-26 DIAGNOSIS — Z87891 Personal history of nicotine dependence: Secondary | ICD-10-CM | POA: Insufficient documentation

## 2023-11-23 DIAGNOSIS — I10 Essential (primary) hypertension: Secondary | ICD-10-CM | POA: Diagnosis not present

## 2023-11-23 DIAGNOSIS — I700 Atherosclerosis of aorta: Secondary | ICD-10-CM | POA: Diagnosis not present

## 2023-11-23 DIAGNOSIS — Z1389 Encounter for screening for other disorder: Secondary | ICD-10-CM | POA: Diagnosis not present

## 2023-11-23 DIAGNOSIS — Z7984 Long term (current) use of oral hypoglycemic drugs: Secondary | ICD-10-CM | POA: Diagnosis not present

## 2023-11-23 DIAGNOSIS — F17211 Nicotine dependence, cigarettes, in remission: Secondary | ICD-10-CM | POA: Diagnosis not present

## 2023-11-23 DIAGNOSIS — M545 Low back pain, unspecified: Secondary | ICD-10-CM | POA: Diagnosis not present

## 2023-11-23 DIAGNOSIS — J449 Chronic obstructive pulmonary disease, unspecified: Secondary | ICD-10-CM | POA: Diagnosis not present

## 2023-11-23 DIAGNOSIS — E1159 Type 2 diabetes mellitus with other circulatory complications: Secondary | ICD-10-CM | POA: Diagnosis not present

## 2023-11-26 ENCOUNTER — Other Ambulatory Visit: Payer: Self-pay | Admitting: Internal Medicine

## 2023-11-26 DIAGNOSIS — R0989 Other specified symptoms and signs involving the circulatory and respiratory systems: Secondary | ICD-10-CM
